# Patient Record
Sex: Female | Born: 1935 | Race: Black or African American | Hispanic: No
Health system: Southern US, Community
[De-identification: ages and names within clinical notes are randomized; demographics above are authoritative.]

---

## 1997-09-12 ENCOUNTER — Ambulatory Visit (HOSPITAL_COMMUNITY): Admission: RE | Admit: 1997-09-12 | Discharge: 1997-09-12 | Payer: Self-pay | Admitting: Nephrology

## 1998-05-07 ENCOUNTER — Encounter: Admission: RE | Admit: 1998-05-07 | Discharge: 1998-06-08 | Payer: Self-pay | Admitting: Unknown Physician Specialty

## 1998-07-26 ENCOUNTER — Encounter: Admission: RE | Admit: 1998-07-26 | Discharge: 1998-09-12 | Payer: Self-pay | Admitting: Orthopaedic Surgery

## 1998-07-28 ENCOUNTER — Emergency Department (HOSPITAL_COMMUNITY): Admission: EM | Admit: 1998-07-28 | Discharge: 1998-07-28 | Payer: Self-pay | Admitting: Emergency Medicine

## 1998-08-06 ENCOUNTER — Encounter: Admission: RE | Admit: 1998-08-06 | Discharge: 1998-11-04 | Payer: Self-pay

## 1999-03-28 ENCOUNTER — Ambulatory Visit (HOSPITAL_BASED_OUTPATIENT_CLINIC_OR_DEPARTMENT_OTHER): Admission: RE | Admit: 1999-03-28 | Discharge: 1999-03-29 | Payer: Self-pay | Admitting: Orthopaedic Surgery

## 1999-04-18 ENCOUNTER — Encounter: Admission: RE | Admit: 1999-04-18 | Discharge: 1999-07-08 | Payer: Self-pay | Admitting: Orthopaedic Surgery

## 1999-04-22 ENCOUNTER — Other Ambulatory Visit: Admission: RE | Admit: 1999-04-22 | Discharge: 1999-04-22 | Payer: Self-pay | Admitting: Obstetrics and Gynecology

## 1999-05-24 ENCOUNTER — Encounter: Admission: RE | Admit: 1999-05-24 | Discharge: 1999-05-24 | Payer: Self-pay | Admitting: Orthopaedic Surgery

## 1999-05-24 ENCOUNTER — Encounter: Payer: Self-pay | Admitting: Orthopaedic Surgery

## 1999-07-02 ENCOUNTER — Encounter: Payer: Self-pay | Admitting: Obstetrics and Gynecology

## 1999-07-02 ENCOUNTER — Encounter: Admission: RE | Admit: 1999-07-02 | Discharge: 1999-07-02 | Payer: Self-pay | Admitting: Obstetrics and Gynecology

## 1999-07-12 ENCOUNTER — Encounter: Payer: Self-pay | Admitting: *Deleted

## 1999-07-12 ENCOUNTER — Ambulatory Visit (HOSPITAL_COMMUNITY): Admission: RE | Admit: 1999-07-12 | Discharge: 1999-07-12 | Payer: Self-pay | Admitting: *Deleted

## 1999-12-16 ENCOUNTER — Encounter: Payer: Self-pay | Admitting: Orthopaedic Surgery

## 1999-12-16 ENCOUNTER — Encounter: Admission: RE | Admit: 1999-12-16 | Discharge: 1999-12-16 | Payer: Self-pay | Admitting: Orthopaedic Surgery

## 2000-04-10 ENCOUNTER — Other Ambulatory Visit: Admission: RE | Admit: 2000-04-10 | Discharge: 2000-04-10 | Payer: Self-pay | Admitting: Obstetrics and Gynecology

## 2000-07-10 ENCOUNTER — Encounter: Admission: RE | Admit: 2000-07-10 | Discharge: 2000-07-10 | Payer: Self-pay | Admitting: Obstetrics and Gynecology

## 2000-07-10 ENCOUNTER — Encounter: Payer: Self-pay | Admitting: Obstetrics and Gynecology

## 2000-08-21 ENCOUNTER — Emergency Department (HOSPITAL_COMMUNITY): Admission: EM | Admit: 2000-08-21 | Discharge: 2000-08-21 | Payer: Self-pay | Admitting: Emergency Medicine

## 2000-08-21 ENCOUNTER — Encounter: Payer: Self-pay | Admitting: Emergency Medicine

## 2000-11-26 ENCOUNTER — Ambulatory Visit (HOSPITAL_BASED_OUTPATIENT_CLINIC_OR_DEPARTMENT_OTHER): Admission: RE | Admit: 2000-11-26 | Discharge: 2000-11-27 | Payer: Self-pay | Admitting: Orthopaedic Surgery

## 2000-12-17 ENCOUNTER — Encounter: Admission: RE | Admit: 2000-12-17 | Discharge: 2001-01-29 | Payer: Self-pay | Admitting: Orthopaedic Surgery

## 2001-04-22 ENCOUNTER — Other Ambulatory Visit: Admission: RE | Admit: 2001-04-22 | Discharge: 2001-04-22 | Payer: Self-pay | Admitting: Obstetrics and Gynecology

## 2001-07-12 ENCOUNTER — Encounter: Payer: Self-pay | Admitting: Family Medicine

## 2001-07-12 ENCOUNTER — Encounter: Admission: RE | Admit: 2001-07-12 | Discharge: 2001-07-12 | Payer: Self-pay | Admitting: Family Medicine

## 2002-04-27 ENCOUNTER — Other Ambulatory Visit: Admission: RE | Admit: 2002-04-27 | Discharge: 2002-04-27 | Payer: Self-pay | Admitting: Gynecology

## 2002-07-09 ENCOUNTER — Emergency Department (HOSPITAL_COMMUNITY): Admission: EM | Admit: 2002-07-09 | Discharge: 2002-07-09 | Payer: Self-pay | Admitting: Emergency Medicine

## 2002-08-03 ENCOUNTER — Ambulatory Visit (HOSPITAL_COMMUNITY): Admission: RE | Admit: 2002-08-03 | Discharge: 2002-08-03 | Payer: Self-pay | Admitting: Family Medicine

## 2002-08-03 ENCOUNTER — Encounter: Payer: Self-pay | Admitting: Family Medicine

## 2002-11-03 ENCOUNTER — Emergency Department (HOSPITAL_COMMUNITY): Admission: EM | Admit: 2002-11-03 | Discharge: 2002-11-03 | Payer: Self-pay | Admitting: Emergency Medicine

## 2003-03-12 ENCOUNTER — Emergency Department (HOSPITAL_COMMUNITY): Admission: AD | Admit: 2003-03-12 | Discharge: 2003-03-12 | Payer: Self-pay | Admitting: Emergency Medicine

## 2003-03-17 ENCOUNTER — Emergency Department (HOSPITAL_COMMUNITY): Admission: EM | Admit: 2003-03-17 | Discharge: 2003-03-17 | Payer: Self-pay | Admitting: Emergency Medicine

## 2003-08-03 ENCOUNTER — Ambulatory Visit (HOSPITAL_COMMUNITY): Admission: RE | Admit: 2003-08-03 | Discharge: 2003-08-03 | Payer: Self-pay | Admitting: Family Medicine

## 2003-08-09 ENCOUNTER — Emergency Department (HOSPITAL_COMMUNITY): Admission: AD | Admit: 2003-08-09 | Discharge: 2003-08-09 | Payer: Self-pay | Admitting: Family Medicine

## 2003-08-10 ENCOUNTER — Emergency Department (HOSPITAL_COMMUNITY): Admission: EM | Admit: 2003-08-10 | Discharge: 2003-08-10 | Payer: Self-pay | Admitting: Family Medicine

## 2003-09-08 ENCOUNTER — Emergency Department (HOSPITAL_COMMUNITY): Admission: EM | Admit: 2003-09-08 | Discharge: 2003-09-08 | Payer: Self-pay | Admitting: Family Medicine

## 2003-09-11 ENCOUNTER — Emergency Department (HOSPITAL_COMMUNITY): Admission: EM | Admit: 2003-09-11 | Discharge: 2003-09-11 | Payer: Self-pay | Admitting: Family Medicine

## 2003-09-12 ENCOUNTER — Emergency Department (HOSPITAL_COMMUNITY): Admission: EM | Admit: 2003-09-12 | Discharge: 2003-09-12 | Payer: Self-pay | Admitting: Emergency Medicine

## 2003-11-19 ENCOUNTER — Emergency Department (HOSPITAL_COMMUNITY): Admission: EM | Admit: 2003-11-19 | Discharge: 2003-11-19 | Payer: Self-pay | Admitting: Family Medicine

## 2004-01-19 ENCOUNTER — Emergency Department (HOSPITAL_COMMUNITY): Admission: EM | Admit: 2004-01-19 | Discharge: 2004-01-19 | Payer: Self-pay | Admitting: Family Medicine

## 2004-02-18 ENCOUNTER — Emergency Department (HOSPITAL_COMMUNITY): Admission: EM | Admit: 2004-02-18 | Discharge: 2004-02-18 | Payer: Self-pay | Admitting: Emergency Medicine

## 2004-05-09 ENCOUNTER — Emergency Department (HOSPITAL_COMMUNITY): Admission: EM | Admit: 2004-05-09 | Discharge: 2004-05-09 | Payer: Self-pay | Admitting: Family Medicine

## 2004-05-21 ENCOUNTER — Other Ambulatory Visit: Admission: RE | Admit: 2004-05-21 | Discharge: 2004-05-21 | Payer: Self-pay | Admitting: Gynecology

## 2004-06-11 ENCOUNTER — Emergency Department (HOSPITAL_COMMUNITY): Admission: EM | Admit: 2004-06-11 | Discharge: 2004-06-11 | Payer: Self-pay | Admitting: Family Medicine

## 2004-06-11 ENCOUNTER — Ambulatory Visit (HOSPITAL_COMMUNITY): Admission: RE | Admit: 2004-06-11 | Discharge: 2004-06-11 | Payer: Self-pay | Admitting: Family Medicine

## 2004-06-15 ENCOUNTER — Emergency Department (HOSPITAL_COMMUNITY): Admission: EM | Admit: 2004-06-15 | Discharge: 2004-06-15 | Payer: Self-pay | Admitting: Emergency Medicine

## 2004-07-11 ENCOUNTER — Emergency Department (HOSPITAL_COMMUNITY): Admission: EM | Admit: 2004-07-11 | Discharge: 2004-07-12 | Payer: Self-pay | Admitting: Emergency Medicine

## 2004-08-06 ENCOUNTER — Ambulatory Visit (HOSPITAL_COMMUNITY): Admission: RE | Admit: 2004-08-06 | Discharge: 2004-08-06 | Payer: Self-pay | Admitting: Family Medicine

## 2004-08-24 ENCOUNTER — Emergency Department (HOSPITAL_COMMUNITY): Admission: EM | Admit: 2004-08-24 | Discharge: 2004-08-24 | Payer: Self-pay | Admitting: Family Medicine

## 2004-09-08 ENCOUNTER — Emergency Department (HOSPITAL_COMMUNITY): Admission: EM | Admit: 2004-09-08 | Discharge: 2004-09-08 | Payer: Self-pay | Admitting: Family Medicine

## 2004-10-25 ENCOUNTER — Emergency Department (HOSPITAL_COMMUNITY): Admission: EM | Admit: 2004-10-25 | Discharge: 2004-10-25 | Payer: Self-pay | Admitting: Family Medicine

## 2004-11-18 ENCOUNTER — Emergency Department (HOSPITAL_COMMUNITY): Admission: EM | Admit: 2004-11-18 | Discharge: 2004-11-18 | Payer: Self-pay | Admitting: Family Medicine

## 2004-12-01 ENCOUNTER — Ambulatory Visit: Payer: Self-pay | Admitting: Physical Medicine & Rehabilitation

## 2004-12-01 ENCOUNTER — Inpatient Hospital Stay (HOSPITAL_COMMUNITY): Admission: EM | Admit: 2004-12-01 | Discharge: 2004-12-11 | Payer: Self-pay | Admitting: Emergency Medicine

## 2004-12-02 ENCOUNTER — Encounter: Payer: Self-pay | Admitting: Cardiology

## 2004-12-02 ENCOUNTER — Ambulatory Visit: Payer: Self-pay | Admitting: Internal Medicine

## 2005-02-10 ENCOUNTER — Encounter: Admission: RE | Admit: 2005-02-10 | Discharge: 2005-03-05 | Payer: Self-pay | Admitting: Family Medicine

## 2005-02-23 ENCOUNTER — Encounter: Admission: RE | Admit: 2005-02-23 | Discharge: 2005-02-23 | Payer: Self-pay | Admitting: Orthopaedic Surgery

## 2005-03-11 ENCOUNTER — Ambulatory Visit: Payer: Self-pay

## 2005-03-12 ENCOUNTER — Ambulatory Visit: Payer: Self-pay | Admitting: *Deleted

## 2005-04-26 ENCOUNTER — Emergency Department (HOSPITAL_COMMUNITY): Admission: EM | Admit: 2005-04-26 | Discharge: 2005-04-26 | Payer: Self-pay | Admitting: Family Medicine

## 2005-05-01 ENCOUNTER — Emergency Department (HOSPITAL_COMMUNITY): Admission: EM | Admit: 2005-05-01 | Discharge: 2005-05-01 | Payer: Self-pay | Admitting: Family Medicine

## 2005-05-23 ENCOUNTER — Other Ambulatory Visit: Admission: RE | Admit: 2005-05-23 | Discharge: 2005-05-23 | Payer: Self-pay | Admitting: Gynecology

## 2005-08-05 ENCOUNTER — Ambulatory Visit (HOSPITAL_COMMUNITY): Admission: RE | Admit: 2005-08-05 | Discharge: 2005-08-05 | Payer: Self-pay | Admitting: Gynecology

## 2005-09-15 ENCOUNTER — Emergency Department (HOSPITAL_COMMUNITY): Admission: EM | Admit: 2005-09-15 | Discharge: 2005-09-15 | Payer: Self-pay | Admitting: Family Medicine

## 2005-10-26 ENCOUNTER — Inpatient Hospital Stay (HOSPITAL_COMMUNITY): Admission: EM | Admit: 2005-10-26 | Discharge: 2005-10-30 | Payer: Self-pay | Admitting: Emergency Medicine

## 2006-02-08 ENCOUNTER — Emergency Department (HOSPITAL_COMMUNITY): Admission: EM | Admit: 2006-02-08 | Discharge: 2006-02-08 | Payer: Self-pay | Admitting: Emergency Medicine

## 2006-10-03 ENCOUNTER — Emergency Department (HOSPITAL_COMMUNITY): Admission: EM | Admit: 2006-10-03 | Discharge: 2006-10-03 | Payer: Self-pay | Admitting: Emergency Medicine

## 2006-12-08 ENCOUNTER — Emergency Department (HOSPITAL_COMMUNITY): Admission: EM | Admit: 2006-12-08 | Discharge: 2006-12-08 | Payer: Self-pay | Admitting: Emergency Medicine

## 2007-02-16 ENCOUNTER — Emergency Department (HOSPITAL_COMMUNITY): Admission: EM | Admit: 2007-02-16 | Discharge: 2007-02-16 | Payer: Self-pay | Admitting: Emergency Medicine

## 2007-05-22 ENCOUNTER — Emergency Department (HOSPITAL_COMMUNITY): Admission: EM | Admit: 2007-05-22 | Discharge: 2007-05-23 | Payer: Self-pay | Admitting: Emergency Medicine

## 2007-10-02 ENCOUNTER — Emergency Department (HOSPITAL_COMMUNITY): Admission: EM | Admit: 2007-10-02 | Discharge: 2007-10-03 | Payer: Self-pay | Admitting: Emergency Medicine

## 2008-12-27 ENCOUNTER — Inpatient Hospital Stay (HOSPITAL_COMMUNITY): Admission: EM | Admit: 2008-12-27 | Discharge: 2009-01-02 | Payer: Self-pay | Admitting: Emergency Medicine

## 2008-12-27 ENCOUNTER — Ambulatory Visit: Payer: Self-pay | Admitting: Cardiology

## 2008-12-28 ENCOUNTER — Encounter (INDEPENDENT_AMBULATORY_CARE_PROVIDER_SITE_OTHER): Payer: Self-pay | Admitting: Internal Medicine

## 2008-12-29 ENCOUNTER — Encounter (INDEPENDENT_AMBULATORY_CARE_PROVIDER_SITE_OTHER): Payer: Self-pay | Admitting: Internal Medicine

## 2009-01-01 ENCOUNTER — Ambulatory Visit: Payer: Self-pay | Admitting: Vascular Surgery

## 2009-04-30 ENCOUNTER — Emergency Department (HOSPITAL_COMMUNITY): Admission: EM | Admit: 2009-04-30 | Discharge: 2009-05-01 | Payer: Self-pay | Admitting: Emergency Medicine

## 2009-10-19 ENCOUNTER — Emergency Department (HOSPITAL_BASED_OUTPATIENT_CLINIC_OR_DEPARTMENT_OTHER): Admission: EM | Admit: 2009-10-19 | Discharge: 2009-10-19 | Payer: Self-pay | Admitting: Emergency Medicine

## 2009-10-19 ENCOUNTER — Ambulatory Visit: Payer: Self-pay | Admitting: Diagnostic Radiology

## 2009-12-09 ENCOUNTER — Emergency Department (HOSPITAL_BASED_OUTPATIENT_CLINIC_OR_DEPARTMENT_OTHER): Admission: EM | Admit: 2009-12-09 | Discharge: 2009-12-09 | Payer: Self-pay | Admitting: Emergency Medicine

## 2009-12-09 ENCOUNTER — Ambulatory Visit: Payer: Self-pay | Admitting: Diagnostic Radiology

## 2010-01-16 ENCOUNTER — Emergency Department (HOSPITAL_COMMUNITY): Admission: EM | Admit: 2010-01-16 | Discharge: 2010-01-16 | Payer: Self-pay | Admitting: Emergency Medicine

## 2010-06-02 ENCOUNTER — Encounter: Payer: Self-pay | Admitting: Gynecology

## 2010-07-25 LAB — URINE CULTURE
Colony Count: NO GROWTH
Culture  Setup Time: 201109080120

## 2010-07-25 LAB — URINE MICROSCOPIC-ADD ON

## 2010-07-25 LAB — DIFFERENTIAL
Basophils Relative: 0 % (ref 0–1)
Eosinophils Absolute: 0.1 10*3/uL (ref 0.0–0.7)
Eosinophils Relative: 2 % (ref 0–5)
Lymphocytes Relative: 30 % (ref 12–46)
Lymphs Abs: 1.7 10*3/uL (ref 0.7–4.0)
Monocytes Absolute: 0.3 10*3/uL (ref 0.1–1.0)
Monocytes Relative: 6 % (ref 3–12)

## 2010-07-25 LAB — URINALYSIS, ROUTINE W REFLEX MICROSCOPIC
Glucose, UA: NEGATIVE mg/dL
Ketones, ur: NEGATIVE mg/dL
Nitrite: NEGATIVE
Protein, ur: NEGATIVE mg/dL
Specific Gravity, Urine: 1.013 (ref 1.005–1.030)
Urobilinogen, UA: 0.2 mg/dL (ref 0.0–1.0)
pH: 5.5 (ref 5.0–8.0)

## 2010-07-25 LAB — CBC
Hemoglobin: 11.7 g/dL — ABNORMAL LOW (ref 12.0–15.0)
MCH: 33.5 pg (ref 26.0–34.0)
MCHC: 34.1 g/dL (ref 30.0–36.0)
RDW: 13 % (ref 11.5–15.5)

## 2010-07-25 LAB — BASIC METABOLIC PANEL
BUN: 31 mg/dL — ABNORMAL HIGH (ref 6–23)
Chloride: 104 mEq/L (ref 96–112)
Creatinine, Ser: 1.02 mg/dL (ref 0.4–1.2)
Sodium: 140 mEq/L (ref 135–145)

## 2010-07-25 LAB — VALPROIC ACID LEVEL: Valproic Acid Lvl: <10 ug/mL — ABNORMAL LOW (ref 50.0–100.0)

## 2010-08-12 LAB — URINALYSIS, ROUTINE W REFLEX MICROSCOPIC
Nitrite: NEGATIVE
pH: 6 (ref 5.0–8.0)

## 2010-08-12 LAB — URINE MICROSCOPIC-ADD ON

## 2010-08-12 LAB — COMPREHENSIVE METABOLIC PANEL
AST: 26 U/L (ref 0–37)
Alkaline Phosphatase: 80 U/L (ref 39–117)
Calcium: 9.6 mg/dL (ref 8.4–10.5)
Creatinine, Ser: 1.03 mg/dL (ref 0.4–1.2)
GFR calc Af Amer: >60 mL/min (ref >60)
GFR calc non Af Amer: 52 mL/min — ABNORMAL LOW (ref >60)
Glucose, Bld: 196 mg/dL — ABNORMAL HIGH (ref 70–99)
Total Bilirubin: 0.6 mg/dL (ref 0.3–1.2)

## 2010-08-12 LAB — CBC
Hemoglobin: 11.8 g/dL — ABNORMAL LOW (ref 12.0–15.0)
Platelets: 213 10*3/uL (ref 150–400)
RBC: 3.41 MIL/uL — ABNORMAL LOW (ref 3.87–5.11)

## 2010-08-12 LAB — DIFFERENTIAL
Basophils Relative: 1 % (ref 0–1)
Eosinophils Absolute: 0 10*3/uL (ref 0.0–0.7)
Eosinophils Relative: 0 % (ref 0–5)
Lymphocytes Relative: 15 % (ref 12–46)
Lymphs Abs: 1 10*3/uL (ref 0.7–4.0)
Monocytes Relative: 3 % (ref 3–12)
Neutro Abs: 5.2 10*3/uL (ref 1.7–7.7)

## 2010-08-12 LAB — URINE CULTURE

## 2010-08-17 LAB — GLUCOSE, CAPILLARY
Glucose-Capillary: 101 mg/dL — ABNORMAL HIGH (ref 70–99)
Glucose-Capillary: 107 mg/dL — ABNORMAL HIGH (ref 70–99)
Glucose-Capillary: 111 mg/dL — ABNORMAL HIGH (ref 70–99)
Glucose-Capillary: 114 mg/dL — ABNORMAL HIGH (ref 70–99)
Glucose-Capillary: 128 mg/dL — ABNORMAL HIGH (ref 70–99)
Glucose-Capillary: 145 mg/dL — ABNORMAL HIGH (ref 70–99)
Glucose-Capillary: 50 mg/dL — ABNORMAL LOW (ref 70–99)
Glucose-Capillary: 75 mg/dL (ref 70–99)
Glucose-Capillary: 81 mg/dL (ref 70–99)
Glucose-Capillary: 87 mg/dL (ref 70–99)
Glucose-Capillary: 88 mg/dL (ref 70–99)
Glucose-Capillary: 93 mg/dL (ref 70–99)
Glucose-Capillary: 95 mg/dL (ref 70–99)
Glucose-Capillary: 98 mg/dL (ref 70–99)

## 2010-08-17 LAB — POCT I-STAT, CHEM 8
Calcium, Ion: 1.14 mmol/L (ref 1.12–1.32)
Glucose, Bld: 140 mg/dL — ABNORMAL HIGH (ref 70–99)
HCT: 30 % — ABNORMAL LOW (ref 36.0–46.0)
Hemoglobin: 10.2 g/dL — ABNORMAL LOW (ref 12.0–15.0)
Potassium: 3.3 mEq/L — ABNORMAL LOW (ref 3.5–5.1)

## 2010-08-17 LAB — IRON AND TIBC: Iron: 46 ug/dL (ref 42–135)

## 2010-08-17 LAB — CBC
HCT: 29.7 % — ABNORMAL LOW (ref 36.0–46.0)
MCV: 97.3 fL (ref 78.0–100.0)
RBC: 3.06 MIL/uL — ABNORMAL LOW (ref 3.87–5.11)
WBC: 6.3 10*3/uL (ref 4.0–10.5)

## 2010-08-17 LAB — CARDIAC PANEL(CRET KIN+CKTOT+MB+TROPI)
CK, MB: 1.8 ng/mL (ref 0.3–4.0)
Relative Index: 1.2 (ref 0.0–2.5)
Relative Index: 1.5 (ref 0.0–2.5)
Total CK: 151 U/L (ref 7–177)
Troponin I: 0.12 ng/mL — ABNORMAL HIGH (ref 0.00–0.06)

## 2010-08-17 LAB — LIPID PANEL
Cholesterol: 220 mg/dL — ABNORMAL HIGH (ref 0–200)
HDL: 60 mg/dL (ref >39)
LDL Cholesterol: 141 mg/dL — ABNORMAL HIGH (ref 0–99)
Total CHOL/HDL Ratio: 3.7 RATIO

## 2010-08-17 LAB — HEMOGLOBIN A1C: Hgb A1c MFr Bld: 6.6 % — ABNORMAL HIGH (ref 4.6–6.1)

## 2010-08-17 LAB — POTASSIUM: Potassium: 3.6 mEq/L (ref 3.5–5.1)

## 2010-08-18 ENCOUNTER — Inpatient Hospital Stay (HOSPITAL_COMMUNITY)
Admission: EM | Admit: 2010-08-18 | Discharge: 2010-08-20 | DRG: 689 | Disposition: A | Discharge status: Skilled Nursing Facility | Payer: Medicare Other | Attending: Internal Medicine | Admitting: Internal Medicine

## 2010-08-18 ENCOUNTER — Emergency Department (HOSPITAL_COMMUNITY): Payer: Medicare Other

## 2010-08-18 DIAGNOSIS — R4789 Other speech disturbances: Secondary | ICD-10-CM | POA: Diagnosis present

## 2010-08-18 DIAGNOSIS — E1169 Type 2 diabetes mellitus with other specified complication: Secondary | ICD-10-CM | POA: Diagnosis present

## 2010-08-18 DIAGNOSIS — L89609 Pressure ulcer of unspecified heel, unspecified stage: Secondary | ICD-10-CM | POA: Diagnosis present

## 2010-08-18 DIAGNOSIS — S0003XA Contusion of scalp, initial encounter: Secondary | ICD-10-CM | POA: Diagnosis present

## 2010-08-18 DIAGNOSIS — Y998 Other external cause status: Secondary | ICD-10-CM

## 2010-08-18 DIAGNOSIS — G309 Alzheimer's disease, unspecified: Secondary | ICD-10-CM | POA: Diagnosis present

## 2010-08-18 DIAGNOSIS — L8994 Pressure ulcer of unspecified site, stage 4: Secondary | ICD-10-CM | POA: Diagnosis present

## 2010-08-18 DIAGNOSIS — W19XXXA Unspecified fall, initial encounter: Secondary | ICD-10-CM | POA: Diagnosis present

## 2010-08-18 DIAGNOSIS — I10 Essential (primary) hypertension: Secondary | ICD-10-CM | POA: Diagnosis present

## 2010-08-18 DIAGNOSIS — R4182 Altered mental status, unspecified: Secondary | ICD-10-CM | POA: Diagnosis present

## 2010-08-18 DIAGNOSIS — N390 Urinary tract infection, site not specified: Principal | ICD-10-CM | POA: Diagnosis present

## 2010-08-18 DIAGNOSIS — F0280 Dementia in other diseases classified elsewhere without behavioral disturbance: Secondary | ICD-10-CM | POA: Diagnosis present

## 2010-08-18 DIAGNOSIS — D638 Anemia in other chronic diseases classified elsewhere: Secondary | ICD-10-CM | POA: Diagnosis present

## 2010-08-18 DIAGNOSIS — Z8673 Personal history of transient ischemic attack (TIA), and cerebral infarction without residual deficits: Secondary | ICD-10-CM

## 2010-08-18 DIAGNOSIS — I2510 Atherosclerotic heart disease of native coronary artery without angina pectoris: Secondary | ICD-10-CM | POA: Diagnosis present

## 2010-08-18 DIAGNOSIS — S0083XA Contusion of other part of head, initial encounter: Secondary | ICD-10-CM | POA: Diagnosis present

## 2010-08-18 DIAGNOSIS — E785 Hyperlipidemia, unspecified: Secondary | ICD-10-CM | POA: Diagnosis present

## 2010-08-18 LAB — GLUCOSE, CAPILLARY
Glucose-Capillary: 135 mg/dL — ABNORMAL HIGH (ref 70–99)
Glucose-Capillary: 144 mg/dL — ABNORMAL HIGH (ref 70–99)
Glucose-Capillary: 42 mg/dL — CL (ref 70–99)

## 2010-08-18 LAB — CBC
Platelets: 269 10*3/uL (ref 150–400)
RBC: 2.64 MIL/uL — ABNORMAL LOW (ref 3.87–5.11)
RDW: 13.1 % (ref 11.5–15.5)
WBC: 8.5 10*3/uL (ref 4.0–10.5)

## 2010-08-18 LAB — URINALYSIS, ROUTINE W REFLEX MICROSCOPIC
Bilirubin Urine: NEGATIVE
Nitrite: NEGATIVE
Protein, ur: 30 mg/dL — AB
Specific Gravity, Urine: 1.019 (ref 1.005–1.030)
Urobilinogen, UA: 0.2 mg/dL (ref 0.0–1.0)

## 2010-08-18 LAB — POCT I-STAT, CHEM 8
BUN: 92 mg/dL — ABNORMAL HIGH (ref 6–23)
Chloride: 104 mEq/L (ref 96–112)
HCT: 26 % — ABNORMAL LOW (ref 36.0–46.0)
Sodium: 135 mEq/L (ref 135–145)

## 2010-08-18 LAB — URINE MICROSCOPIC-ADD ON

## 2010-08-19 LAB — ABO/RH: ABO/RH(D): A POS

## 2010-08-19 LAB — CBC
HCT: 23.9 % — ABNORMAL LOW (ref 36.0–46.0)
Hemoglobin: 8.7 g/dL — ABNORMAL LOW (ref 12.0–15.0)
MCHC: 32.8 g/dL (ref 30.0–36.0)
MCV: 96.8 fL (ref 78.0–100.0)
RDW: 13.2 % (ref 11.5–15.5)
RDW: 13 % (ref 11.5–15.5)
WBC: 6.3 10*3/uL (ref 4.0–10.5)

## 2010-08-19 LAB — CARDIAC PANEL(CRET KIN+CKTOT+MB+TROPI)
CK, MB: 3.3 ng/mL (ref 0.3–4.0)
CK, MB: 3.9 ng/mL (ref 0.3–4.0)
Relative Index: 2.8 — ABNORMAL HIGH (ref 0.0–2.5)
Relative Index: 2.8 — ABNORMAL HIGH (ref 0.0–2.5)
Total CK: 108 U/L (ref 7–177)
Troponin I: 0.04 ng/mL (ref 0.00–0.06)
Troponin I: 0.08 ng/mL — ABNORMAL HIGH (ref 0.00–0.06)

## 2010-08-19 LAB — DIFFERENTIAL
Basophils Absolute: 0 10*3/uL (ref 0.0–0.1)
Basophils Relative: 0 % (ref 0–1)
Eosinophils Relative: 1 % (ref 0–5)
Monocytes Absolute: 0.6 10*3/uL (ref 0.1–1.0)
Neutro Abs: 6.2 10*3/uL (ref 1.7–7.7)

## 2010-08-19 LAB — GLUCOSE, CAPILLARY
Glucose-Capillary: 102 mg/dL — ABNORMAL HIGH (ref 70–99)
Glucose-Capillary: 75 mg/dL (ref 70–99)
Glucose-Capillary: 96 mg/dL (ref 70–99)

## 2010-08-19 LAB — IRON AND TIBC: UIBC: 185 ug/dL

## 2010-08-19 LAB — LIPID PANEL
LDL Cholesterol: 92 mg/dL (ref 0–99)
Triglycerides: 105 mg/dL (ref <150)

## 2010-08-19 LAB — HEMOGLOBIN A1C: Hgb A1c MFr Bld: 5 % (ref <5.7)

## 2010-08-19 LAB — MRSA PCR SCREENING: MRSA by PCR: NEGATIVE

## 2010-08-20 LAB — VITAMIN B12: Vitamin B-12: 495 pg/mL (ref 211–911)

## 2010-08-20 LAB — COMPREHENSIVE METABOLIC PANEL
Alkaline Phosphatase: 65 U/L (ref 39–117)
BUN: 33 mg/dL — ABNORMAL HIGH (ref 6–23)
CO2: 24 mEq/L (ref 19–32)
Chloride: 108 mEq/L (ref 96–112)
Creatinine, Ser: 0.92 mg/dL (ref 0.4–1.2)
GFR calc non Af Amer: 59 mL/min — ABNORMAL LOW (ref >60)
Glucose, Bld: 109 mg/dL — ABNORMAL HIGH (ref 70–99)
Potassium: 4.2 mEq/L (ref 3.5–5.1)
Total Bilirubin: 0.4 mg/dL (ref 0.3–1.2)

## 2010-08-20 LAB — CBC
Hemoglobin: 8.9 g/dL — ABNORMAL LOW (ref 12.0–15.0)
MCHC: 33.5 g/dL (ref 30.0–36.0)
Platelets: 281 10*3/uL (ref 150–400)

## 2010-08-20 LAB — FOLATE: Folate: 18.2 ng/mL

## 2010-08-20 LAB — FERRITIN: Ferritin: 320 ng/mL — ABNORMAL HIGH (ref 10–291)

## 2010-08-20 LAB — GLUCOSE, CAPILLARY
Glucose-Capillary: 86 mg/dL (ref 70–99)
Glucose-Capillary: 95 mg/dL (ref 70–99)

## 2010-08-21 LAB — CROSSMATCH: Unit division: 0

## 2010-08-25 LAB — CULTURE, BLOOD (ROUTINE X 2)

## 2010-08-27 NOTE — H&P (Signed)
Sara [name], Sara [name]:  [id number]  MEDICAL RECORD NO.:  [id number]           PATIENT TYPE:  E  LOCATION:  [facility]                         FACILITY:  [facility]  PHYSICIAN:  Lonia Blood, M.D.      DATE OF BIRTH:  1930-03-28  DATE OF ADMISSION:  08/18/2010 DATE OF DISCHARGE:                             HISTORY & PHYSICAL   PRIMARY CARE PHYSICIAN:  Medical laboratory scientific officer.  PRESENTING COMPLAINT:  Fall and hematoma to the head.  HISTORY OF PRESENT ILLNESS:  The patient is a 77 year old female brought in from [facility] where she states. She has baseline dementia.  She apparently had a fall today and sustained some right frontal hematoma.  After that they noted that she was more confused than usual.  She also had some slurred speech.  Hence she was brought into the emergency room for further management.  The patient is confused, but pleasantly confused.  She seems to be slightly demented, combative sometimes, not allowing her to be examined appropriately.  So history is really obtained from nursing home records from previous hospitalizations.  The patient denied any specific complaint at this point.  PAST MEDICAL HISTORY:  Significant for coronary artery disease, history of CVA,  Alzheimer's dementia, diabetes, hyperlipidemia, hypertension, previous fall with right hip fracture, history of blood loss anemia after hip fracture, and surgery status post left femoral neck fracture repair.  ALLERGIES:  No known drug allergies.  CURRENT MEDICATIONS:  Aricept, Atarax, clopidogrel, Depakote, Glucotrol, and Zestril.  SOCIAL HISTORY:  The patient currently lives in the nursing home at [facility].  No tobacco, alcohol, or IV drug use.  She is demented and relies heavily on assistance.  FAMILY HISTORY:  Noncontributory.  REVIEW OF SYSTEMS:  All systems reviewed so far are negative except per HPI.  PHYSICAL EXAMINATION:  VITAL SIGNS:   Temperature is 97.4, blood pressure 144/54, her pulse is 74, respiratory rate of 15, sats 100% room air. GENERAL:  She is awake, alert, and she is awake but confused.  She is not oriented in person, place. HEENT:  She has a huge hematoma on the right part of her forehead that is slightly above her socket.  PERRL.  EOMI.  No pallor, no jaundice. No rhinorrhea. NECK:  Supple.  No JVD.  No lymphadenopathy. RESPIRATORY:  She has good air entry bilaterally.  No wheezes, no rales. No crackles. CARDIOVASCULAR:  She has S1 and S2.  No murmur. ABDOMEN:  Soft and nontender with positive bowel sounds. EXTREMITIES:  No edema.  Cyanosis or clubbing.  Her left lower extremity has an ulcer which is currently dressed.  Right lower extremity.  No edema. SKIN:  Again she has left lower extremity foot ulcer.  Otherwise she has a hematoma and mild bruising in her forehead but no other bruises observed.  LABORATORY DATA:  Her labs white count is 8.5, hemoglobin 8.5 with platelet of 269.  Sodium 135, potassium 4.4, chloride 104, CO2 25, her BUN is 92 with creatinine of 1.1.  Her urinalysis showed turbid urine with moderate hemoglobin, proteinuria, large leukocyte esterase, wbc's are  too numerous to count, rbc 3-6, and many bacteria.  Her glucose is 144 now.  Her CT head without contrast showed no acute intracranial findings.  There is evidence of previous CVA involving the parietal lobe and the right parietal lobe.  There is associated encephalomalacia and minimal calcification.  There was small scalp hematoma overlying the lateral frontal calvarium.  Chronic lacuna infarcts within the basal ganglia, significant high-density Serevent within the right external auditory canal.  ASSESSMENT:  This is a 77 year old female presenting from nursing facility after a fall.  It is not clear if the fall was mechanical or if she passed.  She was however found to have a glucose of 44 which may explain the reason for  the fall.  She is a diabetic with significant anemia which is normocytic.  Also she urinary tract infection, coronary artery disease, and history of cerebrovascular accident.  PLAN: 1. Altered mental status more than likely from a combination of UTI     and had hypoglycemia.  She is also demented and currently seems     only confused.  She is probably close to her baseline as we speak. 2. Hypoglycemia, more than likely is related to her medications.  She     is on Glucotrol.  For her age these drugs has propensity for     hypoglycemic episodes.  She will need to probably be switched to     another oral hypoglycemics and avoid the Glucotrol and related     drugs.  In the meantime, I will put her on sliding scale insulin.     Continue with D5 as needed.  Continue to monitor her glucose in the     hospital.  Status post fall.  The patient is already in a nursing     facility.  We will get PT/OT in the hospital and hopefully she will     continue with PT/OT in the nursing facility. 3. Diabetes.  Again, we will continue sliding scale insulin and     probably take her Glucotrol at the time of discharge. 4. Hypertension, blood pressure seems reasonable.  We will continue     with home medications. 5. Hyperlipidemia.  We will continue again with home medicines     throughout this hospitalization. 6. Left lower extremity ulcer.  We will get wound care consult. 7. UTI.  I will put her on some IV Rocephin. 8. History of coronary artery disease, I will cycle her enzymes and     follow closely. 9. Anemia.  It is not clear whether this is anemia of chronic disease     or acute blood loss.  I will check stool guaiacs after finishing.     Follow serial H and H.  Type and crossmatch for packed red blood     cells.  If her hemoglobin drops below 8, I will transfuse her. 10.Also history of coronary artery disease, I hope that we will be     able to get her hemoglobin closely to 10. 11.Alzheimer  dementia.  Continue with Aricept.     Lonia Blood, M.D.     Verlin Grills  D:  08/19/2010  T:  08/19/2010  Job:  [id number]  Electronically Signed by Lonia Blood M.D. on 08/27/2010 04:10:35 PM

## 2010-09-09 NOTE — Discharge Summary (Signed)
Sara [name], LABARRE NO.:  [id number]  MEDICAL RECORD NO.:  [id number]           PATIENT TYPE:  O  LOCATION:  3735                         FACILITY:  [facility]  PHYSICIAN:  Marcellus Scott, MD     DATE OF BIRTH:  [birth year]  DATE OF ADMISSION:  08/18/2010 DATE OF DISCHARGE:  08/20/2010                        DISCHARGE SUMMARY - REFERRING   PRIMARY CARE PHYSICIAN:  Dr. Baltazar Najjar at [facility].  Nursing facility is [facility].  DISCHARGE DIAGNOSES: 1. Urinary tract infection. 2. Hypoglycemia in type 2 diabetes mellitus. 3. Fall. 4. Anemia. 5. Dementia with altered mental status. 6. Left lower extremity ulcer. 7. No code blue. 8. History of coronary artery disease, cerebrovascular accident,     hyperlipidemia. 9. Hypertension.  DISCHARGE MEDICATIONS: 1. Ceftin 500 mg p.o. b.i.d. for 5 days. 2. Aricept 10 mg p.o. at bedtime. 3. Atarax 25 mg p.o. q.6 hourly p.r.n. for itching. 4. Beneprotein powder 6 grams per dose, 6 grams mixed with liquid or     soft food p.o. b.i.d. 5. Clopidogrel 75 mg p.o. daily. 6. Certa-Vite over-the-counter 1 tablet p.o. daily. 7. Depakote 125 mg p.o. at bedtime. 8. Lisinopril 20 mg p.o. daily. 9. Vicodin 5/500, 1 tablet p.o. q.4 hourly p.r.n. for pain to be given     30 minutes before all dressing changes.  Total dose of Tylenol to     not exceed 4 grams in 24 hours.  DISCONTINUED MEDICATIONS:  Glipizide XL  PROCEDURES/IMAGING: 1. CT of the head without contrast. Impression: A.  No evidence of traumatic intracranial injury or fracture. B.  Small scalp hematoma overlying the lateral frontal calvarium. C.  Moderately severe cortical volume loss and diffuse small vessel ischemic microangiopathy. D.  Chronic infarct in the right parietal lobe, with associated encephalomalacia and minimal calcification. E.  Chronic lacunar infarcts within the basal ganglion. F.  Significant high-density  cerumen noted within the right external auditory canal.  LABORATORY DATA:  Comprehensive metabolic panel significant for glucose 109, BUN 33, creatinine 0.92, albumin 2.6.  Blood culture x1 is no growth to date.  CBC:  Hemoglobin 8.9, hematocrit 27, white blood cell 5.7, platelets 281,000.  Anemia panel:  Iron 58, total iron binding capacity 243%, desaturation 24, serum folate 18.2, ferritin 320, hemoglobin A1c 5. Cardiac enzymes cycled x3 showed one troponin of 0.08, otherwise normal.  TSH 4.333.  Lipid panel within normal limits.  BNP 167.  MRSA PCR screening was negative.  Urinalysis with too numerous to count white blood cells and many bacteria.  CONSULTATIONS:  Wound care nursing.  DIET:  Diabetic diet.  ACTIVITY:  Out of bed with assist and per skilled nursing facility, physical therapy, and occupational therapy evaluation.  WOUND CARE INSTRUCTIONS:  Left heel boots to reduce pressure to left lower extremity.  Moist gauze dressing to left calf, left toe, left foot, left heel to assist with removal of nonviable tissue twice a day.  COMPLAINTS TODAY:  None.  The patient is pleasantly confused.  She refused in and out cath for urine cultures.  Yesterday she was refusing vital signs and  lab draws.  PHYSICAL EXAMINATION:  GENERAL:  Sara [name] is in no obvious distress. VITAL SIGNS:  Temperature 97.8 degrees Fahrenheit, pulse 68 per minute, respiration 18 per minute, blood pressure 148/74 mmHg, and saturating at 97% on room air.  CBGs range in the 75-96 mg/dL.  She has not required any hypoglycemic agents. RESPIRATORY SYSTEM:  Clear. CARDIOVASCULAR SYSTEM:  First and second heart sounds heard regular. ABDOMEN: Soft, nondistended.  Bowel sounds present. CENTRAL NERVOUS SYSTEM:  The patient is awake, alert, oriented only to self.  No focal neurological deficits. EXTREMITIES:  With chronic full-thickness wound to left lower extremity and stage IV wound to left heel which has been  present on admission. Left heel has a 2 x 5 x 0.2 cm 80% red, 20% slough small pink drainage, no odor, bone palpable.  Left great toe 2 x 1 cm, 100% dry eschar.  Left inner foot 3 x 2 x 0.2 cm 80% slough, 20% red, bone palpable, small pink drainage, no odor.  Posterior left calf with 2 areas of healing full- thickness wounds, 2 x 1 x 0.1 cm and 2 x 0.5 x 0.1 cm both 100% pink with small tan drainage.  No odor.  HOSPITAL COURSE:  Sara [name] is a 77 year old female patient with history of advanced Alzheimer's dementia, multiple previous strokes, hypertension, type 2 diabetes mellitus on oral hypoglycemics who sustained a fall.  She was not able to provide any history.  There is no details regarding syncope.  She was sent because of slurred speech and altered mental status. 1. Urinary tract infection.  The patient was empirically started on IV     Rocephin.  She declined in and out cath for urine culture sample.     Recommend completing 1 week of total antibiotics and repeating     urinalysis and culture after the antibiotic completion. 2. Hypoglycemia.  Possibly secondary to oral hypoglycemic in the     context of variable oral intake.  Her oral hypoglycemics were     discontinued.  Her blood sugars are well-controlled currently.     Recommend continue to check her CBG t.i.d. before meals and at     bedtime.  Her hypoglycemic medications have been discontinued.  If     her sugars start to increase again, then consider half the dose of     the Glucotrol that she was on prior to admission. 3. Fall possibly secondary to multiple factors such as advanced age,     gait instability, dementia, and altered mental status, hypoglycemia     and urinary tract infection.  For skilled nursing facility, PT and     OT evaluation. 4. Anemia which is possibly from chronic disease.  This has remained     stable. 5. Left lower extremity ulcers.  Management per wound care. 6. No code blue.  This was  confirmed through the skilled nursing     facility records as well as talking to her healthcare power of     attorney, Mr. Jeri Modena. 7. Hypertension, controlled.  DISPOSITION:  The patient will be discharged home in stable condition.  This dictator called Ms. Jeri Modena on the phone and Ms. Clark indicated that the patient had severe dementia and confusion at baseline.  She confirmed the no code blue status.  Also her care was updated to Ms. Clark and her questions were answered.  Time taken in coordinating this discharge is 45 minutes.     Marcellus Scott, MD  AH/MEDQ  D:  08/20/2010  T:  08/20/2010  Job:  [id number]  cc:   Maxwell Caul, M.D. [facility]  Electronically Signed by Marcellus Scott MD on 09/09/2010 09:32:02 PM

## 2010-09-24 NOTE — H&P (Signed)
NAMETERRIANA, [name]                ACCOUNT NO.:  [id number]   MEDICAL RECORD NO.:  [id number]          PATIENT TYPE:  INP   LOCATION:  3035                         FACILITY:  [facility]   PHYSICIAN:  Virgie Dad, MD     DATE OF BIRTH:  1929-10-01   DATE OF ADMISSION:  12/27/2008  DATE OF DISCHARGE:                              HISTORY & PHYSICAL   CHIEF COMPLAINT:  Double vision, right eye and acting funny.   HISTORY OF PRESENT ILLNESS:  [name]  with known Alzheimer, diabetes mellitus, murmur of aortic stenosis,  hypertension with a past history of stroke, and history of myocardial  infarction.   The daughter noticed that her mother keeps on falling down, and that the  right eye seemed not to move at all.  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  No headache.  No vomiting.  No problem with hearing.  No  problem with swallowing.  No problem with weakness of the right side of  the body.  The patient is not aphasic.  Workup in the emergency room  showed that the MRI suspect ischemia of the left dorsal medulla at the  level of the medial longitudinal fasciculus.  No mass effect or  hemorrhage.  There is also a progression of the brainstem small vessel  ischemia since 2008, and there is chronic poor flow of the right  internal carotid artery, and also the MRI showed old remote right middle  carotid artery infarct and infarcts in the corona radiata.  The  electrocardiogram showed sinus rhythm.  No acute ST-T changes.   LABORATORY DATA:  Sodium 142, potassium 3.3, BUN 8, creatinine 1,  glucose 140, and calcium 1.14.  Hemoglobin 10.2 and hematocrit 10.   PAST MEDICAL HISTORY:  She has had several infarcts and had left her  with Alzheimer.  She has diabetes and should be on glipizide, but the  patient states she does not take  any medication.  She has a known murmur  of aortic stenosis with no angina and congestive heart failure.  She has  had myocardial infarction in the past, and as mentioned several strokes.  She does not take any aspirin.   SOCIAL HISTORY:  Lives with her son.  Does not smoke.  Does not drink.   ALLERGIES:  No known drug allergy.   REVIEW OF SYSTEMS:  Inconsistent.  The patient does not seem to  remember.  She feels  like crying, and wants to go home.  The daughter  had mentioned that for 2 days, she had noticed her mother to keep on  falling down, and seemed to be adjusting her head when trying to move  around and that at one time, the mother said that her eyes are acting  funny, and that she is seeing double of the son that she lives with.  The patient was seen by Dr. Vickey Huger, the neurologist in the emergency  room  and had agreed with findings of diplopia with extraocular muscles  of the right eye being affected by the new CVA with resultant right  lateral gaze of the right eye.   PHYSICAL EXAMINATION:  VITAL SIGNS:  Blood pressure is 140/80, but when  I examined her, blood pressure was 170/80; temperature 98.2; pulse 62;  respiration 18; O2 sat on room air is 97%.  GENERAL:  The patient is very talkative, but is in denial, says nothing  wrong with her eye as long as she could hear where she is suppose to  look.  There is no expressive aphasia.  No facial asymmetry.  SKIN:  Warm and dry.  HEENT:  The right eye is fixed on the lateral gaze.  Could not do a  funduscopy.  The patient will not sit still.  There is no drooping of  the nasolabial fold.  The tongue is midline and screening for swallowing  is negative, and the patient was able to eat without any problem.  CHEST:  Clear.  No wheezing.  HEART:  A grade 4 systolic murmur over the aortic area, radiating to the  neck.  The carotid upstroke could not be determined because it is masked  with a very loud murmur of aortic stenosis.   There is no gallop and no  pericardial rub.  ABDOMEN:  Flat.  Liver, spleen, and kidney normal.  EXTREMITIES:  Fairly moving.  No sign of DVT.  No pedal edema.  NEUROLOGIC:  Mental Exam:  The patient has Alzheimer, getting confused  and disoriented.  She thought that one of the other ladies in the room  is her niece, but turned out to be her daughter.  The orientation comes  and goes, but the patient is cooperative.  The motor strength is normal  in both upper and lower extremities.  Deep tendon reflexes are normal.  There is no paresis.  Good hand grip.  The patient is right handed and  gait is normal.  Although, the patient tries to orient her head towards  the voice, trying to hone in the location because of her right lateral  gaze position of the right eye.   ASSESSMENT AND PLAN:  1. 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  This is the only      discernible neurologic deficit with positive MRI confirming the new      ischemia.  We will give her Aggrenox, which is a combination of      aspirin and dipyridamole.  We will order an MRA and carotid      ultrasound.  We will also order cardiac echo in relation to her      aortic stenosis.  2. Aortic stenosis, no angina, no congestive heart failure.  Cardiac      echo to assess degree of stenosis and ejection fraction.  3. We will put the patient on a.c and nightly blood sugar, and sliding      scale regular insulin.  4. Noted is anemia of hemoglobin of 10.2, hematocrit of 30.  We will      check stools for hemoccult blood and do iron study.  5. Noted is the hypertension.  The patient's family stated that the      mother had hypertension, but they do not know whether she is taking      medicine or not.  The patient herself denies any  medicine or      hypertension.  Lasix 40 mg IV was given and subsequent blood      pressure was 140/60, lisinopril 10 mg p.o. daily  was also started,      and Apresoline 20 mg IV q.6 h. for persistent systolic blood      pressure greater than 180.   PROGNOSIS:  Guarded.  Considering she has had previous infarcts and  Alzheimer affects, the family does not know what the brother thinks  about resuscitation status of the patient.  We will verify with the son  who is the primary care giver whether she is full code or not.  Dr. Vickey Huger, Neuro, will see her tomorrow morning.      Virgie Dad, MD  Electronically Signed     EA/MEDQ  D:  12/27/2008  T:  12/28/2008  Job:  [id number]

## 2010-09-24 NOTE — Discharge Summary (Signed)
Sara [name], [name]                ACCOUNT NO.:  [id number]   MEDICAL RECORD NO.:  [id number]          PATIENT TYPE:  INP   LOCATION:  3035                         FACILITY:  [facility]   PHYSICIAN:  Michelene Gardener, MD    DATE OF BIRTH:  07-Oct-1929   DATE OF ADMISSION:  12/27/2008  DATE OF DISCHARGE:  01/02/2009                               DISCHARGE SUMMARY   DISCHARGE DIAGNOSES:  1. Acute left dorsal middle cerebrovascular accident.  2. Bilateral significant carotid artery disease.  3. History of multiple infarcts.  4. Dementia.  5. Diabetes mellitus.  6. Questionable history of coronary artery disease.  7. Hypertension.  8. Hyperlipidemia.   DISCHARGE MEDICATIONS:  1. Norvasc 5 mg once a day.  2. Aggrenox 1 tablet twice daily.  3. Aricept 10 mg at bedtime.  4. Glipizide 5 mg once a day.  5. Lisinopril 20 mg once a day.  6. Zocor 20 mg at bedtime.   CONSULTATIONS:  1. Neuro consult by Dr. Pearlean Brownie.  2. Vascular surgery consult by Dr. Miguel Rota.   PROCEDURES:  None.   DIAGNOSTIC STUDIES.:  1. MRI of the brain without contrast in August 18 showed acute      ischemia in the left dorsal middle at the level of medial      longitudinal fasciculus.  There is no associated mass effect or      hemorrhage and there is a progression of brain stem vessel ischemia      since 2008.  2. CT angiography of the head August 20 showed stable intracranial MRI      with poor flow in the right ICA indicated of significant upstream      stenosis and severe left ICA atherosclerosis.  3. CT angio angiography of the neck on August 21 showed occlusion of      the right common carotid artery which is most likely chronic and      there is reconstitution of the right internal and external carotid      arteries via collaterals and there is moderately severe stenosis      and the proximal right internal carotid artery and heavily      calcified plaque at the left carotid bifurcation with at least 70%     diameter stenosis.  4. Echocardiogram August 19 showed ejection fraction of 60%-65% with      normal wall motion and there is no source of stroke.  5. Carotid Doppler August 20 showed occlusion of the right distal      common carotid artery with internal carotid artery appeared to be      fed by external carotid.  Findings consistent with greater than 80%      stenosis involving the left internal carotid artery.   FOLLOW UP:  1. Primary doctor within a week.  2. Dr. Fabienne Bruns within 2 to 3 weeks for evaluation of possible      carotid endarterectomy.   COURSE OF HOSPITALIZATION:  1. This is a 77 year old female who was brought to the hospital for      evaluation of  double vision and right eye acting funny.  MRI of her      head was done and she was found to have acute stroke in the left      dorsal medulla.  The patient was admitted to neuro/tele.      Echocardiogram, carotid Doppler and CT angio of the head CT angio      of the neck were done and detailed results were mentioned above.      Neurology consultation was done where the patient was started on      Aggrenox and statins.  Physical therapy, occupational therapy and      swallow and Speech evaluated the patient.  The patient was      recommended for skilled nursing facility.  2. Carotid artery disease.  CT angiography of the head and neck were      done in addition to carotid artery were done and detailed results      were as mentioned above.  Vascular surgery consultation was done.      His plan is possible carotid endarterectomy and the patient will      follow with Dr. Darrick Penna as an outpatient.  3. Hypertension.  Blood pressure medications were adjusted.  The      patient currently having good blood pressure control on Norvasc and      lisinopril.  4. Hyperlipidemia, Zocor continued.  5. Diabetes mellitus.  The patient was continued on glipizide.      Hemoglobin A1c was 6.6.  6. Otherwise other medical conditions  remained stable.  The patient      will be discharged today in stable condition.   TOTAL ASSESSMENT TIME:  One hour.      Michelene Gardener, MD  Electronically Signed     NAE/MEDQ  D:  01/02/2009  T:  01/02/2009  Job:  [id number]

## 2010-09-24 NOTE — Consult Note (Signed)
NAMEMANDEEP, FERCH NO.:  [id number]   MEDICAL RECORD NO.:  [id number]          PATIENT TYPE:  INP   LOCATION:  3035                         FACILITY:  [facility]   PHYSICIAN:  Melvyn Novas, M.D.  DATE OF BIRTH:  [birth year]   DATE OF CONSULTATION:  DATE OF DISCHARGE:                                 CONSULTATION   She is a 77 year old African American right-handed female who presented  today with an approximately 24-hour history of diplopia.  No associated  findings, at least not by the patient's chief complaint.  Her daughter  and a grandson are at the bedside today.  The daughter states that the  weakness probably occurred with left-sided weakness, it was probably  seen yesterday, but today when she visited her mother she noticed that  her right eye turned outwards and she also believes to have seen left-  sided facial droop.  She insisted on her mother coming to the hospital  apparently against the mother's resistance.  Ms. Zinger is not willing  to stay even in the ER for another hour or two when I explained that she  will have an MRI study today to look for possible stroke.  She was very  difficult to convince to stay.  A CT scan of the brain had already been  obtained without any results for an acute or active lesion.   PAST MEDICAL HISTORY:  1. Progressive dementia with psychosis.  2. Mild ataxia with stereotactic difficulties.  3. History of cerebrovascular disease and older ischemic strokes.  4. Frequent urinary tract infections.  5. Coronary artery disease.  6. Hypertension.  7. Hyperlipidemia.  8. Moderate aortic stenosis.  9. Type 2 diabetes mellitus.  10.Noncompliance with medications, probably secondary to dementia -      psychosis.   The patient was brought to the hospital as I understand by private  vehicle.  Her weight is 68 kg at a height of 5 feet 4 inches,  temperature is 97 degrees Fahrenheit, pulse rate 77, diastolic blood  pressure is  73 and systolic blood pressure is 109.  The patient is  cooperative to a point.  She states that she is a nonsmoker, nondrinker,  has no history of any drug abuse, does not feel any pain nor in any  distress.  The eye deviation has bothered her, but is painless and she  has no preceding trauma that she can recall.  She had no recent febrile  illness, nausea, vomiting, or falls.  She has taken medication regularly  she insists.  I have only a discharge summary from a May 2009 admission  here.  At that time, the patient was on enalapril, glipizide,  metoprolol, Seroquel, simvastatin, lorazepam, Aricept, aspirin 325 mg,  Zocor 40 mg.  She states her primary care doctor is Dr. Shana Chute and she  has named her eye surgeon, Dr. Nile Riggs as a secondary physician.   Today's metabolic panel shows an ionized calcium of 1.14.  Hemoglobin of  10.2 which is low, hematocrit 30, sodium is high at 142, potassium is  borderline low at 3.3,  chloride 104, glucose 140, BUN 8, creatinine is  1.0, pCO2 is 27.  CT shows no acute new lesions.  She lists no known  drug allergies.   The patient is not in acute distress.  She does have S2 heart murmur.  She has normal peripheral pulses.  No bruit.  No clubbing, no cyanosis,  no edema.  No abdominal tenderness.  No skin bruising.  Her blood  pressure is 128/68.  Her heart rate is 18, respiratory rate is 14, O2  saturation is 94% on room air.  She has a Mallampati grade B.  Neurologically, she is of normal nutritional status.  She is alert and  oriented, but she is not necessarily insightful.  Her mental status is  marked by a slight dysarthria, very mild so and by her inability to  recognize or judge the risk of a second stroke if she does not undergo  any TIA workup today.  She is oriented to place, person, and time.  She  insists that Dr. Nile Riggs is her family doctor.  She insists Dr. Shana Chute  has seen her for strokes in the past and has not found her at any risk   of having any more.  She does not recall when she had her last CAT scan  or MRI of the brain, but remembers that she just an hour ago had one of  the CT scans.  She is unwilling to stay for an MRI/MRA study today.  The  last MRA of the brain listed by e-chart was in June 2007 and showed a  sequelae of right mid temporal and temporoparietal ischemic infarct  associated with gliosis, encephalomalacia, and lacunar infarcts in the  semiovale on the right and left paramedian pons.  Again, these pontine  locations may at this time also be the cause for her diplopia.  The  cranial nerve examination shows a mild left-sided facial droop, very  mild and the patient is able to smile fairly equally, close her eyes  tightly, move her eyebrows bilaterally.  She denies any sensory loss  over the face.  Her extraocular movements show no ptosis.  First of all,  she can cross with her right eye through the right lateral visual field,  but does not cross the midline and adducts.  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  She also cannot easily follow a moving  object with her left eye over the midline towards the left side.  This  would normally be seen in an internuclear ophthalmoplegia, yet she does  not have pupillary asymmetry.  The neck is supple.  The patient provides  bilateral pretty good grip strengths.  She has a slight pronator drift  on the left.  She does not have upgoing toes on either side.  She feels  fine touch and pinprick on arms and legs bilaterally.  She elevated all  extremities against gravity.  She could name and repeat.  Her NIH stroke  scale is 2.   At this time, I would want the patient to stay in the Critical Decision  Making Unit.  I think she may be admitted should the MRI returned with  positive findings of stroke.  Otherwise, she can undergo a TIA workup by  the [facility] Stroke Service tomorrow.  My wish is that she be admitted  to Dr. Magda Kiel service and that  the Stroke Service may consult if a  stroke is found.  Melvyn Novas, M.D.  Electronically Signed     CD/MEDQ  D:  12/27/2008  T:  12/28/2008  Job:  [id number]   cc:   Nelva Nay, MD

## 2010-09-24 NOTE — H&P (Signed)
Sara [name], Sara [name]                ACCOUNT NO.:  [id number]   MEDICAL RECORD NO.:  [id number]          PATIENT TYPE:  INP   LOCATION:  3035                         FACILITY:  [facility]   PHYSICIAN:  Virgie Dad, MD     DATE OF BIRTH:  [birth year]   DATE OF ADMISSION:  12/27/2008  DATE OF DISCHARGE:                              HISTORY & PHYSICAL   PRIMARY CARE PHYSICIAN:  Unassigned.   CHIEF COMPLAINT:  Recurrent fall, right eye acting funny.  The patient  complains of double vision.   HISTORY OF PRESENT ILLNESS:  This is a 77 year old African American  female, known Alzheimer's, known diabetes, known murmur of aortic  stenosis, hypertension, past history of myocardial infarction, and  previous CVA.    CANCELLED DICTATION      Virgie Dad, MD     EA/MEDQ  D:  12/27/2008  T:  12/28/2008  Job:  [id number]

## 2010-09-24 NOTE — Consult Note (Signed)
NAMEKERSTIE, AGENT                ACCOUNT NO.:  [id number]   MEDICAL RECORD NO.:  [id number]          PATIENT TYPE:  INP   LOCATION:  3035                         FACILITY:  [facility]   PHYSICIAN:  Janetta Hora. Fields, MD  DATE OF BIRTH:  21-Feb-1930   DATE OF CONSULTATION:  01/01/2009  DATE OF DISCHARGE:                                 CONSULTATION   REQUESTING PHYSICIAN:  [facility].   REASON FOR CONSULTATION:  Left internal carotid artery stenosis with  recent stroke.   HISTORY OF PRESENT ILLNESS:  The patient is a 77 year old female with  recent left brain stroke.  This presented with right lateral gaze.  She  was admitted approximately 5 days ago.  She has essentially returned to  her baseline at this point.  She has a history of a remote right lacunar  stroke.  She also has a history of mild-to-moderate dementia.  During  this hospital admission, she was found to have a left pontine infarct.  Carotid duplex scan and CT angiogram showed a high-grade left internal  carotid artery stenosis and a right common carotid artery occlusion.  Left internal carotid artery stenosis was greater than 80% on duplex.  It was approximately 70% on CTA.  She was not on aspirin prior to  admission.  Atherosclerotic risk factors include hypertension, elevated  cholesterol, and coronary artery disease.   PAST MEDICAL HISTORY:  1. Diabetes.  2. Aortic stenosis.  3. Myocardial infarction.   PAST SURGICAL HISTORY:  Carpal tunnel release bilaterally,  cholecystectomy.   MEDICATIONS:  Aggrenox, Lovenox, Zocor, Glucotrol, Aricept, lisinopril,  and Norvasc.   ALLERGIES:  She has no known drug allergies.   FAMILY HISTORY:  Unremarkable.   REVIEW OF SYSTEMS:  Not reliable secondary to the patient's dementia.  She is alert and oriented to her name and sometimes to place.  She is  not very well oriented to time or current situation.  Her daughter was  present for the interview.   The  patient is able to get around and was living at home with her son  prior to admission to the hospital.  She is fairly functional overall.   PHYSICAL EXAMINATION:  VITAL SIGNS:  Blood pressure is 155/67, heart  rate 82 and regular.  GENERAL:  She is alert, oriented x1 to person, place as mentioned above.  HEENT:  She has poor dentition.  NECK:  She has 2+ carotid pulses with bilateral bruits.  CHEST:  Clear to auscultation.  CARDIAC:  Regular rate and rhythm with a 3/6 systolic murmur.  ABDOMEN:  Soft, nontender, and nondistended.  No masses.  EXTREMITIES:  She has 2+ femoral and 2+ radial pulses bilaterally.  NEUROLOGIC:  She has symmetric upper extremity and lower extremity motor  strength which is 5/5.  Extraocular movements are intact.   IMAGING:  CT angio of the neck dated December 30, 2008 shows chronic  occlusion of the right common carotid artery at the level of the  bifurcation with heavy calcification.  The internal and external carotid  arteries appear to refill from collaterals.  Plaque on the left carotid  bifurcation is also heavily calcified but thought to be approximately  70% narrowing of the left internal carotid artery.  Vertebral arteries  were patent without stenosis bilaterally.   In summary, Ms. Shampine has a right carotid occlusion with high-grade  left internal carotid artery stenosis.  These are probably not a cause  of her chronic deep brain infarct but certainly she is at high risk of  stroke long-term secondary to this carotid occlusive disease.  She was  previously been seen by Dr. Pearlean Brownie, the neurologist who think she would  be a reasonable candidate for carotid stenting.  I agree with this.  I  will discuss with Dr. Pearlean Brownie tomorrow the timing and staging of her  proximal carotid stenting procedure.  Procedure details were roughly  outlined with the patient's daughter this evening.  She understands and  there is agreement to proceed once the date is picked.   After discussing  this with Dr. Pearlean Brownie tomorrow, we will make arrangements most likely to  go ahead and convert her over to Plavix as she will be ready for carotid  stenting and will need to have this circulating preoperatively.      Janetta Hora. Fields, MD  Electronically Signed     CEF/MEDQ  D:  01/01/2009  T:  01/02/2009  Job:  [id number]

## 2010-09-27 NOTE — Discharge Summary (Signed)
Sara [name], [name]                ACCOUNT NO.:  [id number]   MEDICAL RECORD NO.:  [id number]          PATIENT TYPE:  INP   LOCATION:  3741                         FACILITY:  [facility]   PHYSICIAN:  Melissa L. Ladona Ridgel, MD  DATE OF BIRTH:  [birth year]   DATE OF ADMISSION:  11/30/2004  DATE OF DISCHARGE:  12/10/2004                                 DISCHARGE SUMMARY   DISCHARGE DIAGNOSES:  1.  Non-ST elevation myocardial infarction:  The patient was treated      conservatively with aspirin, beta blocker and Statin in light of the      fact that she had a comorbid stroke. A 2-D echocardiogram was      undertaken, results are to follow. The patient also has a comorbid      aortic stenosis and severe stenosis of the left internal carotid.      Catheterization and Cardiolite stress testing were considered; however,      in light of the patient's cerebrovascular accident, it was decided that      she should be managed medically at this time.  2.  Significant bilateral carotid artery stenosis: The patient was evaluated      by CVTS and cardiology as well as neurology. It was decided that the      patient would follow-up with CVTS, Dr. Edilia Bo in six weeks for      reevaluation at this time. She will be maintained on Aggrenox as an      outpatient.  3.  Cerebrovascular accident with right upper extremity hemiparesis:  The      patient remains on Aggrenox and will be treated conservatively. Risk      factors such as diabetes and lipids will be treated. She will need PT      and OT as an outpatient for the right upper extremity hemiparesis.  4.  Dementia and anxiety:  The patient has been continued on her Xanax and      started on Aricept 5 milligrams p.o. q.h.s.  5.  Hypercholesterolemia:  The patient has been maintained on Lipitor 80      milligrams daily.  6.  Hyperhomocysteinemia:  She is will be continued on Foltx 1 tablet p.o.      daily.  7.  Urinary tract infection:  The patient will  complete a course of Cipro      500 milligrams p.o. b.i.d. on December 14, 2004.  8.  Diabetes:  She has been maintained on Lantus 12 units q.12h. and sliding      scale insulin with moderate control of her diabetes. Complete blood      glucoses ranging from 110-150 and 195 at their highest. I elected to      discontinue her metformin in order to obtain better control of her blood      sugars.   MEDICATIONS:  1.  Xanax 0.25 milligrams p.o. daily.  2.  Lipitor 80 milligrams p.o. q.d. q.h.s.  3.  Aggrenox 1 capsule p.o. b.i.d.  4.  Foltx 1 tablet p.o. daily.  5.  Aricept 5 milligrams p.o. q.h.s.  6.  Isosorbide mononitrate 30 milligrams p.o. daily.  7.  Lasix 20 milligrams p.o. daily.  8.  Lopressor 25 milligrams p.o. q.8h. hold for systolic blood pressure less      than 120 or heart rate less than 60.  9.  Cipro 500 milligrams p.o. q.12h. until August 5,2006.  10. Insulin sliding scale with NovoLog CBG of 60-100 0 units; CBG of 101-150      with 3 units; CBC 150-200 with 4 units; CBG of 201-250 with 8 units; CBG      of 251-300 with 12 units; CBG of 301-350 with 16 units and greater than      350 20 units and call a doctor.  11. Multivitamin 1 capsule daily.  12. Lantus 12 units subcutaneous q.12h.  13. Tylenol 650 milligrams p.o. q.4h. p.r.n.  14. Xanax 0.25 milligrams q.8h. p.r.n.   HISTORY OF PRESENT ILLNESS:  The patient is 77 year old African-American  female with past medical history significant for CVA, dementia,  hypertension, diabetes. The patient's family was unable to contact her for  three days. They took the police to her home and found her lying on the  floor upstairs in her own urine. The patient at the time had noted  complaints of chest pain and shortness of breath. She states that she fell  when she was trying to clean her bedroom. The patient was brought to the  emergency room for further evaluation where she was found to have a right  upper extremity hemiparesis  consistent with possible stroke. The patient was  admitted to the hospital for treatment of hyponatremia, UTI and the probable  stroke. She was started on aspirin and a neurology consult was called. The  patient was admitted to telemetry where she was found to have elevated  cardiac enzymes as well as elevated white count of 23.8. She was started on  Cipro I.V. The patient was consulted on by neurology, cardiology and  cardiovascular vascular and thoracic surgeons. Workup of her stroke  identified bilateral internal carotid artery disease showing a 90% focal  stenosis of the distal left common carotid, patent right internal carotid  was noted; however, there was some plaque seen on carotid ultrasound. CVTS  has elected to follow up the patient in six weeks based on her complicated  current medical history and her risk for intervention at this time.  Cardiology also deemed the patient a high risk for further evaluation with  either cath or Cardiolite stress test and have elected to medically manage  her at this time. Of note, the patient's 2-D echocardiogram showed preserved  left ventricular function with EF of 55-65%. There is moderate aortic  valvular regurgitation, moderate mitral valvular regurgitation and increased  left wall thickness.   On the day prior to discharge, the patient remained hemodynamically stable.  She continues to be treated for her UTI with oral Cipro which will finish on  December 14, 2004.   PHYSICAL EXAMINATION:  VITAL SIGNS:  Her vital signs were temperature of  98.1, blood pressure 137/57, pulse of 75, respirations 17. Blood glucoses  ranging from 119-195 max. Saturation 97%.  GENERAL:  This is a well-developed, well-nourished African-American female  in no acute distress. She is very pleasant.  HEENT:  Pupils equal, round, and reactive to light. Extraocular muscles  intact. Moist mucus membranes. NECK:  Supple. There is no JVD, no lymph nodes and no carotid  bruits.  CHEST:  Clear to auscultation. There is no rhonchi, rales, or  wheezes.  CARDIOVASCULAR:  Regular rate and rhythm, positive S1 and S2. She has a loud  2/6 systolic ejection murmur in the left sternal border and right  intercostal space.  ABDOMEN:  Abdomen is soft, nontender, nondistended with positive bowel  sounds.  EXTREMITIES:  Show right upper extremity hemiparesis with 1+ edema. She has  minimal rash that has improved over the course of the stay. All other motor  functions are intact. The patient is able to ambulate without dysfunction.  However, the loss of the right upper extremity does cause some imbalance at  times.  NEUROLOGICAL:  The patient does display signs of dementia but is easily  oriented and very pleasant.   LABORATORY DATA:  Pertinent laboratory values over the course of hospital  stay reveal a sodium of 133, potassium 3.7, CO2 of 24, glucose of 83, BUN of  10, creatinine 0.6, calcium is 9.2. Her CBC reveals a white count of 10.7,  hemoglobin of 10.7, hematocrit 31.8, and platelets of 314,000. Urine culture  is growing E. coli sensitive to Cipro and ceftriaxone. The patient also grew  E. coli from her blood cultures that were sensitive to Cipro as well  ceftriaxone. The patient has had no further temperature spikes or elevated  white counts. Her TSH is 2.328. Vitamin B12 is 568, hemoglobin A1c was 6.9.  Homocystine was 22.9. Lipid panel revealed cholesterol 164, triglycerides of  161. Cholesterol HDL of 36, LDL of 96.   The patient is being evaluated for skilled nursing facility placement and a  further addendum to this discharge summary will be added at the time of  discharge.       MLT/MEDQ  D:  12/09/2004  T:  12/10/2004  Job:  [id number]

## 2010-09-27 NOTE — Consult Note (Signed)
Sara, [name]:  [id number]   MEDICAL RECORD NO.:  [id number]          PATIENT TYPE:  INP   LOCATION:  3741                         FACILITY:  [facility]   PHYSICIAN:  Di Kindle. Edilia Bo, M.D.DATE OF BIRTH:  1929/08/06   DATE OF CONSULTATION:  12/03/2004  DATE OF DISCHARGE:                                   CONSULTATION   REASON FOR CONSULTATION:  Bilateral carotid disease.   HISTORY:  Of note, obtaining the history from the chart is this patient has  significant dementia and I am really unable to get any reliable history from  the patient herself. She is convinced that she is at home and it was with  great difficulty that she let me talk to her and examine her. According to  records, however, she was admitted on December 01, 2004 after she had been found  at home. The family had been unable to contact her for three days. When they  eventually got into the house, she was found on the floor and was unable to  move her right arm. She was brought to the emergency room and it was felt  that she had likely had a stroke. In addition, she was noted to have  increased troponins and cardiology has evaluated the patient and it is felt  that she has likely had a subacute non-ST MI. There was also some concern  that she had some rhabdomyolysis from being down at home for so long. Again,  I could not get any reliable history from the patient so I am not sure if  she has had previous history of stroke, TIAs, expressive or receptive  aphasia. Clearly at this time, she had profound right arm weakness.   PAST MEDICAL HISTORY:  1.  Significant for type 2 diabetes.  2.  Dementia.  3.  Hypertension.   The social history and family history are unobtainable from the patient.   CURRENT MEDICATIONS:  Include aspirin, hydrochlorothiazide, nifedipine ER,  Diovan, Lipitor, metformin, lorazepam, hydroxyzine, alprazolam.   REVIEW OF SYSTEMS:  She denies any recent chest pain,  chest pressure. Review  of systems is otherwise documented in her admission history and physical.   Blood pressure is 118/68, heart rate is 72. I do not detect any carotid  bruits. Lungs were clear bilaterally to auscultation. Cardiac exam she has a  regular rate and rhythm with a systolic murmur. Abdomen is soft and  nontender. She has palpable femoral pulses. Both feet are warm and  adequately perfused. She has profound right upper extremity weakness with  really no strength in the right upper extremity. She has good strength in  the right lower extremity and normal strength on the left.   I have reviewed her carotid duplex scan. She has evidence of possible right  internal carotid artery occlusion. The left internal carotid artery has a 60-  79% stenosis. End-diastolic velocity on the left is 84 cm/sec consistent  with a less than 80% left carotid stenosis. She has had an MRI which showed  no definite new infarct with some bilateral old subcortical  lesions. The  patient is apparently scheduled for MRA of the neck.   This patient appears to have had a left hemispheric stroke associated with  right arm weakness. She does have a 60-79% left carotid stenosis and  normally might be considered for left carotid endarterectomy once she  recovered from her stroke. However, I think she is currently very poor  candidate to be considered for surgery given her history of significant  dementia, her subacute non-ST MI, and other medical issues including  transient elevated creatinine and white count. I will follow for now but  likely see her back in the office in 6-8 weeks to reevaluate her but  currently I do not think she is a candidate for left carotid endarterectomy.  We could reconsider this if she shows some significant improvement in the  next 6-8 weeks.       CSD/MEDQ  D:  12/03/2004  T:  12/04/2004  Job:  [id number]

## 2010-09-27 NOTE — H&P (Signed)
NAMERAGNA, KRAMLICH NO.:  [id number]   MEDICAL RECORD NO.:  [id number]          PATIENT TYPE:  INP   LOCATION:  3102                         FACILITY:  [facility]   PHYSICIAN:  Ladell Pier, M.D.   DATE OF BIRTH:  01/09/1930   DATE OF ADMISSION:  11/30/2004  DATE OF DISCHARGE:                                HISTORY & PHYSICAL   HISTORY OF PRESENT ILLNESS:  The patient is a 77 year old African-American  female with past medical history significant for cerebrovascular accident,  dementia, hypertension, diabetes. Per daughter, they have not been able to  contact patient for approximately 3 days. They finally called the police and  went over to the house. The patient was found lying on the floor upstairs in  her urine. She has no complaints of chest pain or shortness of breath. The  patient denies all this. She states that she has been fine. She states that  she fell and hit her right shoulder when she tried to clean her bedroom. The  family states that over the past year, patient's memory problem has been  progressively getting worse.   PAST MEDICAL HISTORY:  1.  Hypertension.  2.  Diabetes.  3.  Dyslipidemia.  4.  Anxiety.  5.  Dementia.   FAMILY HISTORY:  Mother died of old age. Father died of questionable  etiology.   SOCIAL HISTORY:  She does not smoke. Does not drink. She is divorced with 4  children living, two died. 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   MEDICATIONS:  She takes Diovan, Lipitor, hydrochlorothiazide, Glucophage,  aspirin and Lorazepam.   ALLERGIES:  No known drug allergies.   REVIEW OF SYSTEMS:  As first stated in the HPI. She does complain of some  right shoulder pain and dry mouth but no chest pain. No shortness of breath.   PHYSICAL EXAMINATION:  VITAL SIGNS:  Temperature 98.7, blood pressure  138/59, pulse of 99, respiratory rate 12, pulse ox 98%.  HEENT:  Normocephalic and atraumatic. Pupils are  equal, round, and reactive  to light. Throat without erythema. She has poor dentition.  CARDIOVASCULAR:  Regular rate and rhythm with 2 out of 6 systolic murmur,  radiating to the carotids.  LUNGS:  Clear to auscultation bilaterally. No wheezes, rhonchi.  ABDOMEN:  Soft, nontender, and nondistended. Positive bowel sounds.  EXTREMITIES:  Without edema.  NEUROLOGIC:  Cranial nerves 2-12 are intact. Strength 5 out of 5 in the left  upper, left lower, right lower extremities and in the right upper extremity  strength is 1 out of 5. Deep tendon reflexes 1% symmetric. She is oriented  to place. She thinks that the year is 50.   LABORATORY DATA:  Sodium 121, potassium 3.4, chloride 91, CO2 18, BUN 41,  glucose 106, white blood cell count 23.8, hemoglobin 12.5, platelets  251,000. MCV 91.   ASSESSMENT/PLAN:  1.  Question of cerebrovascular accident. The patient had a history of      cerebrovascular accident in the past. This looks like an acute stroke  but since the onset is not sure, since she has not been seen in 3 days.      Will get a CT scan of the head, 2-D echocardiogram, carotid Doppler's,      MRI, MRA, cardiac enzymes. Start on aspirin. Consult neurology in the      morning.  2.  Diabetes. Will hold p.o. medications and cover with sliding scale      insulin.  3.  Hypertension. Hold her p.o. medications as well as do Lopressor p.r.n.      for blood pressure.  4.  Dyslipidemia. Will check fasting lipid panel.  5.  Urinary tract infection. Start on Ciprofloxacin intravenously b.i.d.  6.  Hypokalemia. Replete and check magnesium level.  7.  Leukocytosis secondary to infection.  8.  Hyponatremia. Will give intravenous fluids, check urinary sodium and      creatinine. Calculate the fractionated execution of sodium.       NJ/MEDQ  D:  12/01/2004  T:  12/01/2004  Job:  [id number]   cc:   Theone Stanley, MD

## 2010-09-27 NOTE — Op Note (Signed)
[facility]  Patient:    Sara [name], Sara [name]                      MRN: [id number] Proc. Date: 07/12/99 Attending:  Sharyn Dross., M.D. CC:         Jethro Bastos, M.D.                           Operative Report  PREOPERATIVE DIAGNOSIS:  Family history of colon cancer.  POSTOPERATIVE DIAGNOSIS:  Normal colonoscopic examination to the cecum.  Slight  stricture possibly secondary to positional load located around the splenic flexure region.  OPERATION:  Colonoscopy.  SURGEON:  Sharyn Dross., M.D.  PREMEDICATION:  Demerol 50 mg IV and Versed 5 mg IV over a 10 minute period of time.  INSTRUMENTS:  The Olympus video colonoscope.  INDICATION:  This pleasant 77 year old female was brought in for an evaluation regarding a strong family history of colon cancer.  The patient is relatively stable without any major complaints but it was advised via the patients physician that she gets evaluated at this time.  She initially was scheduled for July 04, 1998, but due to complications that occurred, she had to change her schedule to  today.  PHYSICAL EXAMINATION:  GENERAL:  She was a pleasant female in no distress.  VITAL SIGNS:  Stable.  HEENT:  Anicteric.  NECK:  Supple.  LUNGS:  Clear.  HEART:  Regular rate and rhythm without heaves, thrills, murmurs, or gallops.  ABDOMEN:  Soft.  No tenderness.  No hepatosplenomegaly appreciated.  EXTREMITIES:  Grossly within normal limits.  PLAN:  Proceed with the colonoscopic examination.  INFORMED CONSENT:  The patient was advised of the procedure, the indications, and the risks involved.  A video was reviewed and consent form was obtained.  DESCRIPTION OF PROCEDURE:  The patient was brought in the endoscopy unit where n IV for IV sedating medication was started.  A monitor was placed on the patient to monitor the patients vital signs and oxygen saturation.  Nasal oxygen at two liters  per minute was used.  After adequate sedation was performed, the procedure was begun.  The patient was given GoLYTELY and Reglan as a bowel prep.  The patient tolerated the prep well without any complications.  The quality of the prep was excellent.  The instrument was advanced to the patient lying in the left lateral position to approximately 100 cm proximal to the proximal ascending colon region.  There appeared to be no gross abnormalities such as masses or polyps appreciated. However, there was areas of a stricture that was noted around the splenic flexure region at this time.  There was no mass or firm lesion within this region at this time and upon retracting the instrument back there was no evidence of a stricture noted.  This was possibly positioning and some tortuosity with the advancement f the instrument that was noted.  Otherwise there were no other abnormalities noted.  The vascular pattern appeared to be well within normal limits throughout the entire colon and the mucosal pattern showed no evidence of diverticular disease that was present.  There appeared to be no abnormalities within the cecal pouch that was appreciated, although the instrument was not able to advance within this region.  With manipulation of the area, no masses or abnormalities could be appreciated.  The instrument was subsequently retracted back and no evidence  of internal or hemorrhoids noted.  TREATMENT:  Conservative management at this time.  Would recommend repeating the procedure in approximately two years to insure no other abnormalities noted at his time. DD:  07/12/99 TD:  07/14/99 Job: [id number]

## 2010-09-27 NOTE — Discharge Summary (Signed)
NAMEMARRIAN, Sara [name]:  [id number]   MEDICAL RECORD NO.:  [id number]          PATIENT TYPE:  INP   LOCATION:  3014                         FACILITY:  [facility]   PHYSICIAN:  Elliot Cousin, M.D.    DATE OF BIRTH:  April 05, 1930   DATE OF ADMISSION:  10/26/2005  DATE OF DISCHARGE:  10/30/2005                                 DISCHARGE SUMMARY   DISCHARGE DIAGNOSES:  1.  Progressive dementia with worsening psychosis.  2.  Mild ataxia with a radiographic findings of severe cerebrovascular      disease and old ischemic strokes.  3.  Urinary tract infection.  4.  Coronary artery disease/hypertension/hyperlipidemia/moderate aortic      stenosis.  5.  Type 2 diabetes mellitus.  6.  Noncompliance secondary to dementia.   SECONDARY DISCHARGE DIAGNOSES AND PAST MEDICAL HISTORY:  Please see the  dictated history and physical.   DISCHARGE MEDICATIONS:  1.  Lopressor 100 mg half a tablet twice daily (50 mg b.i.d.).  2.  Altace 2.5 mg daily.  3.  Glucophage 500 mg daily.  4.  Glipizide 5 mg daily.  5.  Aricept 5 mg at bedtime.  6.  Seroquel 25 mg half a tablet b.i.d.  7.  Centrum Silver 1 tablet daily.  8.  Aspirin 81 mg daily.  9.  Zocor 20 mg q.h.s.   DISCHARGE DISPOSITION:  The patient was discharged to home on June21,2007 in  improved and stable condition.  The family was advised to follow up with the  patient's primary care physician at [facility] in 1 week for  further evaluation and management.   PROCEDURE PERFORMED:  1.  CT scan of the head on June17,2007.  The results revealed no acute      intracranial findings, stable cortical volume loss and chronic      microvascular white matter disease, and remote infarcts.  2.  MRI of the brain on June18,2007.  The results revealed no evidence of      acute ischemia, mild atrophy, moderate severe small vessel disease,      lacunar infarct in the right centrum semiovale and also the left pons,      old  ischemic infarcts in the right temporoparietal region, mild to      moderate sinusitis changes in the ethmoid and frontal sinuses.  MRA of      the brain revealed suspicion of 50-70% stenosis of the left ICA and no      evidence of intracranial aneurysm is seen.   CONSULTATIONS:  Antonietta Breach, M.D.   HISTORY OF PRESENT ILLNESS:  The patient is a 77 year old lady with a past  medical history significant for dementia, coronary artery disease,  peripheral vascular disease, and diabetes mellitus, who presented to the  emergency department on June17,2007 with a chief complaint of dizziness and  worsening confusion.  Per the family's account, the patient also had  difficulty walking.  She was brought to the emergency department.  During  the evaluation in the emergency department, she was found to be mildly  febrile with a temperature  of 100.3, yet hemodynamically stable.  CT scan of  the head revealed no acute intracranial findings, although there was  evidence of old strokes.  The patient was therefore admitted for further  evaluation and management.   HOSPITAL COURSE:  1.  DEMENTIA WITH PSYCHOSIS/MILD ATAXIA/SEVERE CEREBROVASCULAR DISEASE.  The      patient was started on Aricept 5 mg daily.  Supportive care was given.      The patient required as-needed restraints, as she was somewhat confused      during the hospitalization.  The patient also was easily directed at      times.  She was eventually started on treatment with Seroquel, initially      12.5 mg daily.  However, it was increased to b.i.d.  Trazodone 25 mg      q.h.s. p.r.n. was also added.  For further evaluation, a number of      studies were ordered.  They included a TSH which was within normal      limits at 1.8, vitamin B12 also within normal limits at 317, folate      level within normal limits at 19.9, RPR which was nonreactive, and a      homocystine level of 14.9 which was within normal limits.  Blood      cultures  were also ordered, given the patient's low grade fever.  The      blood cultures remained negative during the hospital course.  A MRI of      the brain was ordered for evaluation of an acute stroke.  The results      are decribed above.  In essence, the MRI of the brain revealed no acute      strokes; however, there was evidence of old strokes and severe      cerebrovascular disease.  The mild ataxia that the patient demonstrated      on admission and during the hospital course probably was more likely      secondary to the cerebrovascular disease.  The patient, however,      demonstrated no severe ataxia or dysmetria.   Occupational therapy and physical therapy were both ordered during  hospitalization.  The patient was able to ambulate with a walker and with  assistance.  The therapist recommended home health physical therapy and  occupational therapy, which were ordered at the time of hospital discharge.  The patient was started on aspirin therapy 81 mg daily.  At the time of  hospital discharge, she was somewhat less confused, and the family felt that  she was somewhat better than she had been several days prior.  The patient's  daughter, Parklawn Sink, is her full time caretaker.  Roosevelt Sink also has a nursing  background.  She will continue to provide primary care for her mother.  [place] Sink  also plans to obtain a health care power of attorney.   At the time of hospital discharge, the patient's regimen consisted of  Aricept and Seroquel.  The Seroquel may need to be titrated up accordingly.  However, further management will be deferred to her primary care physician  at [facility]   1.  URINARY TRACT INFECTION.  An urinalysis was ordered at the time of      hospital admission.  It revealed WBCs and bacteria.  She was started on      ciprofloxacin intravenously q. 12 hours.  She remained on Cipro      throughout the hospitalization.  The urine culture,  however, was     negative.  At the time  of hospital discharge, the ciprofloxacin was      discontinued.  She was completely afebrile, and her white blood cell      count was normal at 5.1.   1.  CORONARY ARTERY DISEASE/HYPERTENSION/HYPERLIPIDEMIA.  The patient has      known coronary artery disease.  She was restarted on aspirin therapy, as      well as beta blocker therapy.  A fasting lipid panel was ordered and      revealed a total cholesterol of 309, triglycerides of 203, HDL      cholesterol of 46, and LDL cholesterol of 222.  The patient was started      on Zocor 20 mg q.h.s.  Apparently, the patient had been noncompliant      with medication therapy.  Her blood pressure was mildly to moderately      elevated during the hospital course.  The Lopressor was titrated up to      50 mg b.i.d. and Altace was added at 2.5 mg daily.  Further management      will be deferred to her primary care physician at [facility].   1.  TYPE 2 DIABETES MELLITUS.  The family was unaware of the exact      medication regimen the patient had been treated with in the past.      During the hospitalization, she was started on a sliding scale insulin      regimen, as well as Glucophage and glipizide.  Her hemoglobin A1c was      7.0.  The patient's capillary blood sugars were assessed q.a.c. and      q.h.s. and covered with the sliding scale insulin accordingly.  At the      time of hospital discharge, she was advised to continue Glucophage at      500 mg daily and glipizide at 5 mg daily.  Further adjustments may be      needed in the outpatient setting.   DISCHARGE DISPOSITION:  The patient was in improved and stable condition at  hospital discharge.  Home health occupational therapy and physical therapy  have been ordered.  A rolling walker and a shower chair were also ordered  for home.   ADDENDUM:  Dr. Jeanie Sewer, psychiatrist, was consulted for evaluation.  Per  his assessment, the patient did not have capacity for informed consent.   He  agreed with medical management.      Elliot Cousin, M.D.  Electronically Signed     DF/MEDQ  D:  11/03/2005  T:  11/03/2005  Job:  [id number]

## 2010-09-27 NOTE — Consult Note (Signed)
Sara [name], Sara [name]:  [id number]   MEDICAL RECORD NO.:  [id number]          PATIENT TYPE:  INP   LOCATION:  3102                         FACILITY:  [facility]   PHYSICIAN:  Sara [name], M.D. LHCDATE OF BIRTH:  1930/02/01   DATE OF CONSULTATION:  12/01/2004  DATE OF DISCHARGE:                                   CONSULTATION   PRIMARY CARE PHYSICIAN:  Unknown.   CARDIOLOGIST:  She is new to [facility].   REASON FOR CONSULTATION:  Increased troponin in the setting of acute CVA.   HISTORY OF PRESENT ILLNESS:  Sara [name] is a 77 year old woman with a  history of previous stroke, diabetes, and mild dementia, but no known  history of coronary artery disease, who is admitted to the ER secondary to  acute left brain stroke. As part of a workup she was found to have elevated  troponin and thus we were consulted.   Sara [name] is unable to give an accurate history. According to her admission  note her daughters had not been able to contact her for about three days, so  they called the police. Sara [name] was found lying on the floor in her urine  and stool. She was brought to the ER and found to have new right lower  extremity and a right-sided facial droop. She underwent a head CT that was  negative, but did show an old temporal infarct as well as some scattered  lacunar infarcts. Sara [name] denied any chest pain or shortness of breath.  She was thus admitted to the neuro ICU. She was subsequently found to have  an elevated troponin-I of 6.19 and thus we are called to consult. Her EKG  was nonacute.   REVIEW OF SYSTEMS:  Unavailable given the patient's condition.   PAST MEDICAL HISTORY:  1.  History of CVA.  2.  Diabetes mellitus.  3.  Dementia.  4.  Hypertension.   I am unable to gather any other past medical history at this time.   MEDICATIONS:  According to the ER note she is on aspirin, Ativan, Atarax,  Diovan, Lipitor, nifedipine, metformin, and  hydrochlorothiazide.   MEDICATION ALLERGIES:  She has no known drug allergies.   SOCIAL/FAMILY HISTORY:  Unavailable. I do know that she does not smoke or  drink alcohol.   PHYSICAL EXAMINATION:  Sara [name] is resting comfortably, lying nearly flat  in bed. Her respirations are unlabored. She is atrial fibrillation. Her  blood pressure is 112/41 with a heart rate of 98. She is saturating 99% on  room air.  HEENT: Sclerae anicteric. EOMI. There are no xanthelasma. Mucous membranes  are moist. She has poor dentition.  NECK: Supple. There is no JVD. Carotids are 2+ bilaterally with bilateral  bruits radiating off her aortic valve.  CARDIAC: She is tachycardic and regular. She has a 2/6 crescendo/decrescendo  mid to late peaking systolic ejection murmur at the right sternal border.  Her S2 is markedly diminished. There is no rub or gallop.  LUNGS: Clear to auscultation.  ABDOMEN: Soft, nontender, nondistended. There is no hepatosplenomegaly.  There are no bruits. There are no masses appreciated.  EXTREMITIES: Warm. There is no clubbing, cyanosis, or edema. Femoral pulses  are 2+ bilaterally without bruits. DP pulses are 1+ bilaterally.  NEUROLOGIC: She is mildly dysarthric. She is alert and oriented times two.  She tells me the year is 62.  She has very mild right facial droop. She is  markedly weak in the right upper extremity, 1/5. She has an upgoing right  Babinski. Otherwise, she has normal strength and her Babinski is down on the  left.   LABORATORY DATA:  Sodium 124, potassium 3.5, chloride 89, bicarbonate 16,  BUN 48, creatinine 1.6, anion gap 19, glucose 132. Notably her creatinine  was 0.8 in March 2006. White count is 23.8, hematocrit 36.7%, platelet count  251,000. AST is 140, ALT 63, alkaline phosphatase 134,000, bilirubin 1.5.  Total CK is 2366 with MB fraction of 30, troponin-I 6.19. Albumin 2.8, INR  1.0.   Head CT, noncontrast, shows an old temporal stroke with no  acute changes.  There are scattered lacunar infarcts. EKG shows sinus tachycardia of 100,  with questionable old septal infarct. There is a biphasic T-wave in V3, but  no other significant ST-T wave changes.   ASSESSMENT/PLAN:  Sara [name] is an unfortunate 77 year old woman as above  who is now admitted with acute stroke complicated by prolonged downtime,  rhabdomyolysis, metabolic acidosis, and acute renal insufficiency.   Given her elevated troponin in the setting of a normal MB fraction, this is  likely a subacute non-ST elevation myocardial infarction.  Physical  examination is also notable for aortic stenosis which is probably at least  moderate.   At this point would recommend:  1.  Treatment with aspirin, beta blocker, and Statin. Would hold      anticoagulation at this time as she is not symptomatic and do not want      to run the risk of converting her stroke to hemorrhagic infarct.  2.  Check a 2-D echocardiogram to evaluate her LV function and degree of      aortic stenosis.  3.  Rule out myocardial infarction on telemetry.  4.  Continue to treat underlying illnesses including her stroke,      rhabdomyolysis, acute renal insufficiency, and urinary tract infection      as you are doing.  5.  She will likely need cardiac catheterization in a few days, but this      will depend on her progress.  6.  Hold metformin.   We appreciate the consult and will continue to follow with you.       DB/MEDQ  D:  12/01/2004  T:  12/01/2004  Job:  [id number]

## 2010-09-27 NOTE — Op Note (Signed)
[place]  Patient:    Sara [name], Sara [name]                       MRN: [id number] Proc. Date: 11/26/00 Adm. Date:  [id number] Attending:  Randolm Idol                           Operative Report  PREOPERATIVE DIAGNOSIS: 1. Rotator cuff tear, left shoulder with impingement. 2. Degenerative joint disease, acromioclavicular joint, left shoulder. 3. Left carpal tunnel syndrome. 4. Left ring trigger finger.  POSTOPERATIVE DIAGNOSIS: 1. Rotator cuff tear, left shoulder with impingement. 2. Degenerative joint disease, acromioclavicular joint, left shoulder. 3. Left carpal tunnel syndrome. 4. Left ring trigger finger.  OPERATION PERFORMED: 1. Open rotator cuff tear repair with acromioplasty. 2. Resection of distal clavicle. 3. Release of volar carpal ligament, left wrist with decompression of median    nerve. 4. Release A1 pulley of left ring finger for decompression of triggering.  SURGEON:  Claude Manges. Cleophas Dunker, M.D.  ASSISTANT:  Jamelle Rushing, P.A.  ANESTHESIA:  General orotracheal with supplemental interscalene block.  COMPLICATIONS:  None.  INDICATIONS FOR PROCEDURE:  The patient is a 77 year old female has had a left-sided bilateral carpal tunnel syndrome with previous right carpal tunnel release with excellent results.  She has had difficulty with numbness and tingling in the radial three digits of her left hand recently with "dropping objects" as well as triggering of the ring finger.  She has also developed pain in the left shoulder and is status post right rotator cuff tear repair. She has had a recent shoulder arthrogram revealing a complete rotator tear. She has had difficulty with overhead motions and pain and thus is to have exploration of her left shoulder and repair of the rotator cuff, acromioplasty, and distal clavicle resection as well as release of the left carpal tunnel and triggering of the ring finger.  DESCRIPTION  OF PROCEDURE:  With the patient comfortable on the operating room table and under general orotracheal anesthesia, the patient was placed in a semisitting position with the shoulder frame.  A preoperative interscalene block had been applied and the patient was quite comfortable.  The entire left upper extremity was prepped with DuraPrep from the tips of the fingers to the base of the neck.  Sterile draping was performed.  Stockinet was applied to the extremity.  The initial procedure was performed on the shoulder.  An incision was made longitudinally from the acromioclavicular joint coursing over the anterior aspect of the deltoid muscle, approximately 2 inches in length via sharp dissection.  The incision was carried down to the subcutaneous tissue.  Small bleeders were Bovie coagulated.  The acromioclavicular joint was identified as the capsule was incised and the incision carried further distally through the deltoid fascia.  Self-retaining retractor was inserted.  There was considerable debris at the [facility] joint with degenerative meniscal and cartilage tissue.  This was removed with a rongeur. Distal clavicle resection was performed with the oscillating saw.  Bone wax was applied to the bleeding bone surface.  There was no evidence of impingement beneath the remaining clavicle.  An anterior inferior acromioplasty was performed.  There was considerable thickness of the acromion as well as anterior spurring obviously causing impingement.  The acromioplasty was finished with a rasp and bone wax was applied.  The rotator cuff was then evaluated.  There was about an inch and  a half tear at the attachment of the supraspinatus and infraspinatus tendon at the humeral head.  There were osteophytes along the humeral head which were removed with a rongeur.  The joint was explored.  I did not see any chondromalacia but I did not feel or visualize a biceps tendon.  A bony trough was made laterally  in the area of the rotator cuff tear.  Cuff material on both sides of the tear was debrided with a 15 blade knife and a scissors such that it had nice sharp bleeding edges.  There was delamination of the tendon which was also debrided.  One Mitek anchor was inserted and then more posteriorly, the repair was performed cuff to cuff using 0 Tycron suture.  I had an excellent repair. There was no evidence of any remaining impingement with the arm in abduction, internal or external rotation.  The joint was then copiously irrigated with saline solution.  The deltoid fascia was closed with a running 0 Vicryl with excellent repair.  The subcutaneous was closed with 2-0 Vicryl, skin closed with skin clips.  0.25% Marcaine without epinephrine was injected into the wound.  A sterile tourniquet was then applied to the left arm.  The stockinet was removed and the left hand prepared for release of the trigger finger and carpal tunnel.  A lead hand was utilized.  About an inch incision was  made along the longitudinal palmar crease.  The arm had been elevated and Esmarch exsanguinated with the tourniquet at 250 mmHg.  Self-retaining retractor was inserted.  By blunt dissection the soft tissue was elevated off the palmaris longus which was separated longitudinally.  The distal extent of the volar carpal ligament was identified.  A Glorious Peach was placed beneath it to protect the median nerve.  There was a very well-developed palmaris brevis muscle and a very thickened volar carpal ligament.  I carefully incised both structures along their ulnar border, completed decompressing the nerve along that level. The retractor was placed beneath the wrist so that I could visualize the remainder of the volar carpal ligament and under direct visualization, it was released with a blunt tipped scissor.  The nerve was pale.  I did not see a median vein but on complete decompression it was reconstituted.  The  motor recurrent branch was intact.  I did not see any evidence of chronic tenosynovitis.  The wound was then irrigated.  The skin was closed with 4-0 Ethilon.   A transverse incision was made along the transverse palmar crease at the level of the ring finger.  By blunt dissection, the soft tissue was then elevated off the flexor tendon.  A small incision was made in the sheath and the sheath and A-1 pulley were then released proximally and distally such that there was no further triggering of the finger.  I could visualize complete release.  The wound was irrigated with saline solution.  The skin was closed with 4-0 Ethilon.  0.25% Marcaine without epinephrine was injected along the carpal tunnel and trigger finger incisions.  A sterile bulky dressing was applied followed by a posterior splint and an Ace bandage.  The tourniquet was deflated with immediate capillary refill to her fingers.  A sterile bulky dressing had been applied to the shoulder.  A sling was applied.  The patient was awakened and returned to the post anesthesia care unit without complications.  PLAN:  [facility]  Discharge in the morning.  Office Monday. Percocet for pain.DD:  11/26/00 TD:  11/26/00 Job: [id number]

## 2010-09-27 NOTE — Consult Note (Signed)
NAMEAYLAH, YEARY NO.:  [id number]   MEDICAL RECORD NO.:  [id number]          PATIENT TYPE:  INP   LOCATION:  3014                         FACILITY:  [facility]   PHYSICIAN:  Antonietta Breach, M.D.  DATE OF BIRTH:  1930-05-05   DATE OF CONSULTATION:  10/27/2005  DATE OF DISCHARGE:  10/30/2005                                   CONSULTATION   REASON FOR THE REQUEST:  Psychosis.   Mrs. Ravan is a 77 year old divorced female admitted on the 17th of June  due to acute mental status changes.  The patient refused to go back to her  home because she could hear voices and people there, which the family could  not see.  The patient has been having delusions that her ex-husband came to  her home and stole her license plate off of her car.  The daughter had  removed the license plate due to fear of the patient driving while having a  dementia condition.   The patient has had a progressive drop in her memory and forgot how to put  her car into drive.  At one point, she sat in her car with it running in the  middle of the street.  The patient has continued to be confused and has been  refusing some important treatment.   PAST PSYCHIATRIC HISTORY:  The patient has a history of progressive decline  in memory and executive function and previously acquired skills.  Noncompliance with important medical treatment has been an ongoing issue.   There is no known history of functional psychiatric illness.   FAMILY PSYCHIATRIC HISTORY:  None.   SOCIAL HISTORY:  The patient lives at home with her daughter.  There are 4  grandchildren in the home, as well.  The patient has been divorced for many  years.  She has 4 children; 2 sons and 2 daughters.  Patient has no  healthcare power of attorney.   OCCUPATION:  She worked at [employer] and at [employer] in various positions.   ALCOHOL AND ILLEGAL DRUGS:  None.   RELIGION:  SDA.   GENERAL MEDICAL HISTORY:  CAD with history of MI.   MEDICATIONS:  MAR reviewed.  Psychotropics include Haldol 1-2 mg q.4 h.  p.r.n. severe agitation.  THE PATIENT HAS NO KNOWN DRUG ALLERGIES.   LABORATORY DATA:  Glucose is 159.  The liver function tests are within  normal limits.  TSH is normal at 1.9.  B12 317, folate 19.4.   REVIEW OF SYSTEMS:  Review of systems is acquired by record only.  CONSTITUTIONAL:  Afebrile.  HEAD:  No known trauma.  EYES:  No new visual  disturbances.  ENT:  No sore throat reported.  RESPIRATION:  No cough.  CARDIOVASCULAR:  The patient does have a history of CAD and an MI.  GASTROINTESTINAL:  No nausea, vomiting, or diarrhea.  GENITOURINARY:  No  dysuria or urinary tract infections current.  HEMATOLOGIC/LYMPHATIC:  Unremarkable.  ENDOCRINE:  History of diabetes mellitus.  NEUROLOGIC:  History of cerebrovascular disease and a cerebrovascular accident.  SKIN:  Unremarkable.  MUSCULOSKELETAL:  No deformities, weaknesses, or atrophy.   EXAMINATION:  VITAL SIGNS:  Temperature 97.8, pulse 54, respirations 20,  blood pressure 151/60, O2 saturation is 96% on room air.  MENTAL STATUS EXAM:  Mrs. Emerick is alert.  She is cooperative.  She is only  oriented to the self and she thinks she is at a friend's home.  Her speech  is non-pressured, but see thought process below.  She is maintaining good  eye contact, sitting up partially in her hospital bed.  Thought process:  Rambling and tangential, normal rate.  Concerning fund of knowledge and  orientation, definitively she thinks that the year is 47, the month is  November.  She also believes she has 5 children but then states 2 boys and 2  girls.  While her son is in the room she identifies him as her ex-husband.  Thought content:  There are no thoughts of harming herself, no thoughts of  harming others.  No hallucinations.  She is paranoid.  She states that her  Posey belt is a belt for her pants.  On affect, she is guarded and  suspicious.  Her insight is poor.  Her  judgment is impaired.  Memory is poor  for immediate and recent and partially impaired for remote.  Her fund of  knowledge and intelligence are decreased below that of her estimated  premorbid level.  Concentration decreased.   ASSESSMENT:  AXIS I:  1.  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  293.81.  2.  Dementia not otherwise specified.  AXIS II:  None.  AXIS III:  See the general medical problems above.  AXIS IV:  General medical.  AXIS V:  30.   The patient has critical memory deficits and impairment in judgment.  She  does not have the capacity for informed consent.   RECOMMENDATIONS:  Her paranoia has removed; however, if it does not resolve,  would start Seroquel 25 mg p.o. q.h.s. and would titrate as tolerated by 25  mg every day to approximately 100 mg q.h.s. with a trial off within 2  months; would take the usual cautions about involuntary movements and  hyperglycemia, as well as excess sedation.   Will continue the Haldol p.r.n. severe agitation, combativeness as written  at this time.   The patient's decapacitating condition is not due to the psychosis but is  due to the dementia, and it is anticipated that this capacity will not  return.  It is, therefore, recommended that this patient be placed in a  skilled nursing facility or other similar environment.      Antonietta Breach, M.D.  Electronically Signed     JW/MEDQ  D:  10/31/2005  T:  10/31/2005  Job:  [id number]

## 2010-09-27 NOTE — H&P (Signed)
[name], GRIEDER NO.:  [id number]   MEDICAL RECORD NO.:  [id number]          PATIENT TYPE:  INP   LOCATION:  3014                         FACILITY:  [facility]   PHYSICIAN:  Hettie Holstein, D.O.    DATE OF BIRTH:  Apr 20, 1930   DATE OF ADMISSION:  10/26/2005  DATE OF DISCHARGE:                                HISTORY & PHYSICAL   PRIMARY CARE PHYSICIAN:  Dr. __________  with [facility].   CHIEF COMPLAINT:  Dizziness.   HISTORY OF PRESENTING ILLNESS:  Mrs. Sara [name] is a pleasant, African-  American female, 77 years old, who suffers from dementia and has been in  denial for quite some time.  She lives at home with her daughter, Sara [name].  They have been struggling with a confusion and a delusional type of behavior  at home for several weeks.  Mrs. Sara [name] currently refused to return to her  home because she felt that she could hear voices and people there, which the  family has verified was not the case.  She had additional delusions that her  ex-husband came to her home and stole her license plates off of her  automobile.  Her daughter, due to fears of her driving, have actually  removed her license plates.  They were initially concerned when she pulled  out of her driveway one day and forgot how to put her car into drive and sat  with her car running in the middle of the street.  In any event, there have  been multiple episodes of problems such as this.  This a.m., however, Mrs.  [name] was unable to walk  her daughter said that when they were  transferring her and subsequently they brought her to the emergency  department.   Sara [name] does have a prior history of noncompliance into her most recent  hospitalization in July of 2006 where she suffered a non-ST segment  elevation MI in addition to cerebrovascular accident.  She had some  transient right arm weakness at the time, and these have resolved at  present; however, she has continued to have some  elements of anxiety and  confusion and hypersensitivity to the diagnosis of Alzheimer's dementia.  In  fact, her prior primary care physician she had refused to return to because  of just the simple mention of the possibility of Alzheimer's dementia.  In  the emergency department, she was noted to have 7 to 10 WBCs and is being  admitted for further evaluation of altered mental status and ataxia.  She  does have some dysmetria in the emergency department, and prior imaging  studies do reveal evidence of cerebrovascular disease and additionally she  does have evidence of carotid artery disease.  She was evaluated by Dr.  Edilia Bo of [facility] and, due to her other underlying issues at that time, she  was not deemed a candidate for endarterectomy.  I am not certain of the  status at this time and followup with Dr. Edilia Bo of [facility].  I suspect due to  her current dementia and compliance issues, she  is likely not the best  candidate for this procedure.   PAST MEDICAL HISTORY:  1.  As noted above, a non-ST segment elevation MI in July 2007.  She did      have a __________ ejection fraction of about 55 to 65%.  She was seen in      consultation by Dr. Gala Romney of [facility], and she did have a      two-D echocardiogram revealing some aortic stenosis and mild elevation      of pulmonary artery pressure.  2.  She suffers from Alzheimer's dementia with paranoia and delusions.  In      fact, at this time she is unable to recite the date, the year, or a      month.  She is oriented to place and person.  3.  Cerebrovascular accident without evident residual.  4.  Diabetes mellitus.  5.  Moderate aortic stenosis.  6.  Anxiety.  7.  Carotid artery disease.   MEDICATIONS:  The family states that Sara [name] does not take any of her  medications.  She will occasionally take her blood pressure medications,  which the family cannot recall; however, her medical records and discharge  summary dictated  by Dr. Derenda Mis reveal that she had been on Lopressor  and Imdur as well as Lasix.  I do see that she was on Foltx as well.  She  had been under consideration for the start of medications for her dementia,  which she was quite hesitant to initiate.   ALLERGIES:  No known drug allergies.   SOCIAL HISTORY:  The patient is separated for many years now.  She has four  living children, two sons, two daughters.  She lives with one of her  daughters currently as she recently has refused to return to her home due to  the forces as described above.  Her daughter, Sara [name], is reached at [phone].  There was no healthcare power-of-attorney in place at this time.  She  formerly worked at [employer] as well as [employer] in the past in the various  positions.   FAMILY HISTORY:  Noncontributory.   REVIEW OF SYSTEMS:  Family states that they have a noticed a functional  decline and some falling at home.  Her oral intake has been okay.  She has  had no diarrhea or nausea or vomiting, no chest pain or shortness of breath.  They had an appointment this past Wednesday with [facility].   PHYSICAL EXAMINATION:  VITAL SIGNS:  In the emergency department, her blood  pressure was 169/67, temperature 100.3, heart rate 79, respirations 20, O2  saturation 99% on room air.  GENERAL:  The patient is pleasant, alert, answering questions though some  component of confabulation that is consistent.  She is oriented to place,  however, disoriented to the time.  She thinks it is 59 in October and  seems to guess at the date of the 7th.  HEENT:  Her head is normocephalic.  There is no facial droop.  Extraocular  muscles are intact.  NECK:  Supple, nontender.  There is no palpable thyromegaly.  CARDIOVASCULAR:  Normal S1 and S2.  There is a right sternal border mid  systolic crescendo-decrescendo murmur of 3/6.  LUNGS:  Clear bilaterally.  ABDOMEN:  Soft and nontender. EXTREMITIES:  No edema.  No focal  weakness.  NEUROLOGIC:  Reveals some dysmetria.   LABORATORY DATA:  Sodium 135, potassium 3.6, BUN 10, creatinine 0.7,  glucose  159, AST/ALT 22/12, albumin 3.1, WBC 7, hemoglobin 12, platelet count of  221, MCV of 96.  Urinalysis revealed 7 to 10 WBCs.  CT revealed no acute  bleed.  Some old infarcts were noted.   ASSESSMENT:  1.  Ataxia.  2.  Dementia with psychotic features.  3.  Medical noncompliance.  4.  Urinary tract infection.  5.  Low albumin.  6.  Hypertension.   PLAN AT THIS TIME:  We will admit Mrs. Colla to the Neuro-Telemetry floor  for ataxia in addition to progressive functional decline for physical  therapy and occupational therapy evaluation.  We will check an MRI as she  does have increased risk factors for a stroke with a recent stroke and  medical noncompliance.  In addition, we  will do a workup for reversible causes of dementia, initiate a trial of  Aricept, and check an MRI.  She will need a psychiatric evaluation and  certainly the family will need the assistance with movement towards  establishing a healthcare power-of-attorney, which may require a competency  evaluation.      Hettie Holstein, D.O.     ESS/MEDQ  D:  10/26/2005  T:  10/26/2005  Job:  [id number]   cc:   Dr. Kittie Plater ?????

## 2010-09-27 NOTE — Consult Note (Signed)
Sara [name], Sara [name]                ACCOUNT NO.:  [id number]   MEDICAL RECORD NO.:  [id number]          PATIENT TYPE:  INP   LOCATION:  3102                         FACILITY:  [facility]   PHYSICIAN:  Gustavus Messing. Orlin Hilding, M.D.DATE OF BIRTH:  10-24-1929   DATE OF CONSULTATION:  12/02/2004  DATE OF DISCHARGE:                                   CONSULTATION   CHIEF COMPLAINT:  Right-sided weakness.   PRIMARY CARE PHYSICIAN:  Dr. Woodfin Ganja   HISTORY OF PRESENT ILLNESS:  Sara [name] is a 77 year old right-handed black  woman with a history of dementia and an old stroke, although it may have  been unrecognized.  Family was unable to reach the patient who lives alone  for about three days.  They eventually called EMS and the family and EMS  arrived more or less together.  Patient was found on the floor in her  bedroom in a puddle of urine.  She was conscious, but could not get up.  She  had right arm weakness and there was right facial droop.  The right facial  droop but the total right arm weakness persists.  She was also confused.  CT  scan was negative for any acute abnormality.  Did show an old right MCA  branch infarction which was incidentally demonstrated on a previous study in  March of this year.  However, an MRI was performed and was questionably  positive for an acute tiny brain stem infarct on the right by DWI.  There is  also suspicion on the MRA of right proximal ICA stenosis and the old right  MCA infarct is again demonstrated.   REVIEW OF SYSTEMS:  Patient denies any significant pain in the right upper  extremity currently, but has had right arm problems including carpal tunnel  and right rotator cuff problems but she has otherwise been able to use the  arm perfectly well.  She is confused.  No current chest pain or shortness of  breath.   PAST MEDICAL HISTORY:  1.  Dementia.  Dr. Dorothe Pea tried to begin Aricept three months ago but the      patient refused.  This was around  the time that she had an episode of      abrupt change with increased confusion.  CT showed an old right MCA      infarct, but nothing acute at that time.  2.  Anxiety.  3.  Hyperlipidemia.  4.  Type 2 diabetes.  5.  Hypertension.  6.  Old stroke which is unknown clinically.   CURRENT MEDICATIONS:  Lovenox, NovoLog, Lopressor, Cipro, aspirin 325 mg,  Valium, Xanax, Lipitor, Tylenol, Phenergan, Dulcolax.   ALLERGIES:  No known drug allergies.   SOCIAL HISTORY:  She is divorced.  Lives alone.  Has four children.  She is  a remote smoker.  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   FAMILY HISTORY:  Positive for coronary artery disease and cerebrovascular  disease.   PHYSICAL EXAMINATION:  VITAL SIGNS:  Temperature 98.2, pulse 85, blood  pressure 115/36,  respirations 14 with 95% on room air.  HEENT:  Head is normocephalic.  She has some bruising on the left chin.  NECK:  Supple without bruits.  NEUROLOGIC:  Mental status:  She is awake and alert.  There is normal  naming, repetition.  She does well with comprehension with simple commands,  but has trouble with complex commands.  She is not oriented to her age, the  year, month, day, or date.  She is oriented to [facility].  She knows her  primary physician's name.  She is oriented to city and state, but not  county.  She registers three out of three, but can only recall one of them  after distraction.  She spells world forward correctly, but backwards  splits some letters dlorw.  Cranial nerve examination:  Pupils are equal  and reactive.  Visual fields are full to confrontation.  Extraocular  movements are intact.  Facial sensation is normal.  Facial motor activity is  normal without any droop at this time.  Hearing is intact.  Palate is  symmetric and tongue is midline.  Motor examination:  She has 5/5 strength  without drift in the left upper extremity and bilateral lower extremities.  She has 1/5 right  upper extremity diffusely.  She can wiggle her thumb a  little, slight movement at the wrist, slight shoulder movement, not much  biceps, a little bit of triceps.  It is fairly diffuse and it is consistent  with a central pattern such as a stroke.  There is no fasciculations,  atrophy, or tremors.  Deep tendon reflexes are 1+ in the left.  Biceps  otherwise negative diffusely.  Downgoing toes bilaterally.  Coordination:  Left finger to nose and bilateral heel to shin are normal.  She can not do  right finger to nose.  Sensory examination is intact.   MRI shows no definite acute abnormalities.  There is a question of a tiny  little pontomedullary lesion on the right.  I do not really think that is  real.  She has an old right MCA branch infarct, old left pons lacune,  bilateral cerebral lacunar infarctions, and small vessel disease.  There is  some problems with the A1 segment on the right, small caliber right ICA, and  carotid Doppler shows blunted distal right CCA and 60-80% left ICA with  suspicion of EC/IC collateral flow on the right.  In addition, the patient  is also felt to have sustained a non Q-wave MI.   IMPRESSION:  1.  Right arm weakness which is global with right facial weakness which has      cleared clinically consistent with a stroke, although the MRI is      unimpressive.  2.  Suspicion of right internal carotid artery/CCA stenosis which would not      be relevant to her current symptoms and question of significant left      internal carotid artery stenosis 60-80% upper end.  3.  Dementia.   RECOMMENDATIONS:  MRI of the neck to better define the carotids bilaterally.  She may need cerebral angiogram if an aggressive approach such as surgery or  stenting is to be considered.  Aspirin for now until this is resolved and  Aricept for the dementia.  Stroke service will follow.      CAW/MEDQ  D:  12/02/2004  T:  12/02/2004  Job:  [id number]

## 2010-10-31 ENCOUNTER — Emergency Department (HOSPITAL_COMMUNITY): Payer: Medicare Other

## 2010-10-31 ENCOUNTER — Inpatient Hospital Stay (HOSPITAL_COMMUNITY)
Admission: EM | Admit: 2010-10-31 | Discharge: 2010-11-08 | DRG: 504 | Disposition: A | Discharge status: Skilled Nursing Facility | Payer: Medicare Other | Attending: Internal Medicine | Admitting: Internal Medicine

## 2010-10-31 DIAGNOSIS — F0390 Unspecified dementia without behavioral disturbance: Secondary | ICD-10-CM | POA: Diagnosis present

## 2010-10-31 DIAGNOSIS — I2510 Atherosclerotic heart disease of native coronary artery without angina pectoris: Secondary | ICD-10-CM | POA: Diagnosis present

## 2010-10-31 DIAGNOSIS — M869 Osteomyelitis, unspecified: Principal | ICD-10-CM | POA: Diagnosis present

## 2010-10-31 DIAGNOSIS — Z8673 Personal history of transient ischemic attack (TIA), and cerebral infarction without residual deficits: Secondary | ICD-10-CM

## 2010-10-31 DIAGNOSIS — E119 Type 2 diabetes mellitus without complications: Secondary | ICD-10-CM | POA: Diagnosis present

## 2010-10-31 DIAGNOSIS — R197 Diarrhea, unspecified: Secondary | ICD-10-CM | POA: Diagnosis present

## 2010-10-31 DIAGNOSIS — L02619 Cutaneous abscess of unspecified foot: Secondary | ICD-10-CM | POA: Diagnosis present

## 2010-10-31 DIAGNOSIS — D638 Anemia in other chronic diseases classified elsewhere: Secondary | ICD-10-CM | POA: Diagnosis present

## 2010-10-31 DIAGNOSIS — E785 Hyperlipidemia, unspecified: Secondary | ICD-10-CM | POA: Diagnosis present

## 2010-10-31 DIAGNOSIS — I10 Essential (primary) hypertension: Secondary | ICD-10-CM | POA: Diagnosis present

## 2010-10-31 LAB — BASIC METABOLIC PANEL
CO2: 26 mEq/L (ref 19–32)
Calcium: 10.3 mg/dL (ref 8.4–10.5)
GFR calc non Af Amer: 56 mL/min — ABNORMAL LOW (ref >60)
Glucose, Bld: 96 mg/dL (ref 70–99)
Potassium: 4.5 mEq/L (ref 3.5–5.1)
Sodium: 134 mEq/L — ABNORMAL LOW (ref 135–145)

## 2010-10-31 LAB — CBC
Hemoglobin: 9 g/dL — ABNORMAL LOW (ref 12.0–15.0)
MCH: 31.5 pg (ref 26.0–34.0)
Platelets: 267 10*3/uL (ref 150–400)
RBC: 2.86 MIL/uL — ABNORMAL LOW (ref 3.87–5.11)

## 2010-10-31 LAB — MRSA PCR SCREENING: MRSA by PCR: NEGATIVE

## 2010-10-31 LAB — DIFFERENTIAL
Basophils Absolute: 0 10*3/uL (ref 0.0–0.1)
Basophils Relative: 1 % (ref 0–1)
Eosinophils Absolute: 0.3 10*3/uL (ref 0.0–0.7)
Neutro Abs: 4.8 10*3/uL (ref 1.7–7.7)
Neutrophils Relative %: 61 % (ref 43–77)

## 2010-10-31 LAB — GLUCOSE, CAPILLARY: Glucose-Capillary: 76 mg/dL (ref 70–99)

## 2010-11-01 ENCOUNTER — Inpatient Hospital Stay (HOSPITAL_COMMUNITY): Payer: Medicare Other

## 2010-11-01 LAB — HEMOGLOBIN A1C: Mean Plasma Glucose: 131 mg/dL — ABNORMAL HIGH (ref <117)

## 2010-11-01 LAB — MAGNESIUM: Magnesium: 1.9 mg/dL (ref 1.5–2.5)

## 2010-11-01 NOTE — H&P (Signed)
Sara [name], MUMPOWER NO.:  [id number]  MEDICAL RECORD NO.:  [id number]  LOCATION:  WLED                         FACILITY:  [facility]  PHYSICIAN:  Talmage Nap, MD  DATE OF BIRTH:  03-May-1930  DATE OF ADMISSION:  10/31/2010 DATE OF DISCHARGE:                             HISTORY & PHYSICAL   PRIMARY CARE PHYSICIAN:  Maxwell Caul, M.D.  History obtainable from the ED physician, Dr. Dierdre Highman and also from the ED notes.  No family member available to give any additional history.  CHIEF COMPLAINT:  Pain, left foot.  HISTORY OF PRESENT ILLNESS:  Patient is a 77 year old African-American female, who is a resident of the nursing home, who was actually discharged from the hospital on August 20, 2010 for urinary tract infection.  She was, however, brought back today because of pain in the left foot.  There was, however, no history of fever.  There was no history of chills.  There was no history of rigor.  Patient is known to have diabetic foot ulcer.  There was no history of nausea or vomiting. Although, patient complained about worse pain in the left foot and no family member to give any additional history.  PAST MEDICAL HISTORY:  Positive for: 1. Dementia. 2. Coronary artery disease. 3. CVA (multiple lacunar strokes). 4. Hypertension. 5. Diabetes mellitus. 6. Hyperlipidemia. 7. History of urinary tract infection.  PAST SURGICAL HISTORY:  Left femoral neck fracture, status post hemiarthroplasty.  PREADMISSION MEDICATIONS:  Include: 1. Med Pass supplement drink 4 ounces three times a day. 2. Plavix 75 mg p.o. daily. 3. Lisinopril 20 mg one p.o. daily. 4. Vicodin (hydrocodone/APAP) 5/500 one p.o. q.4h. p.r.n. 5. Central-Vite (multivitamin) one p.o. daily. 6. Beneprotein powder 6 gram per dose two scoops b.i.d. 7. Aricept (donepezil) 10 mg p.o. daily.  ALLERGIES:  No known allergy.  SOCIAL HISTORY:  Patient is said to live in a nursing home.  No  history of alcohol or tobacco use and she is demented.  FAMILY HISTORY:  Unknown.  REVIEW OF SYSTEMS:  Patient presently denies any headaches.  No blurry vision.  No nausea or vomiting.  No fever.  No chills.  No rigor.  No cough.  No chest pain or shortness of breath.  Denies any abdominal discomfort.  No diarrhea or hematochezia.  No dysuria or hematuria. Complained about pain in the left foot.  No intolerance to heat or cold and no neuropsychiatric disorder.  PHYSICAL EXAMINATION:  GENERAL:  Very pleasant lady, demented, but not in any respiratory distress, well-hydrated. PRESENT VITAL SIGNS:  Blood pressure is 154/82, pulse is 55, respiratory rate 18, temperature is 99.2. HEENT:  Mild pallor, but pupils are reactive to light and extraocular muscles are intact. NECK:  No jugular venous distention.  No carotid bruit.  No lymphadenopathy. CHEST:  Clear to auscultation. HEART:  Sounds are one and two. ABDOMEN:  Soft, nontender.  Liver, spleen and kidney not palpable. Bowel sounds are positive. EXTREMITIES:  Show left diabetic foot ulcer.  No pedal edema. NEUROLOGIC EXAMINATION:  Nonfocal. MUSCULOSKELETAL SYSTEM:  Shows arthritic changes in the knees and in the feet. NEUROPSYCHIATRIC EVALUATION:  Showed patient to be presently confused.  LABORATORY DATA:  Initial complete blood count with differential showed WBC of 7.9, hemoglobin of 9.0, hematocrit of 27.5, MCV of 96.2 with the platelet count of 267,000 with normal differentials and basic metabolic panel showed sodium of 134, potassium of 4.5, chloride of 99 with the bicarb of 26, glucose is 96, BUN is 35 and creatinine 0.97.  DIAGNOSTIC STUDIES:  Imaging studies done on the patient include x-ray of left foot, which showed erosive/postoperative changes in the distal phalanx of the great toe and diffuse osteopenia without any acute abnormality.  IMPRESSION: 1. Diabetic foot ulcer, rule out osteomyelitis. 2. Dementia. 3.  History of cerebrovascular accident. 4. Hypertension. 5. Hyperlipidemia. 6. Coronary artery disease. 7. Type 2 diabetes mellitus. 8. Anemia.  PLAN:  Plan is to admit patient to general medical floor.  Patient will be saline locked.  She will be antibiotics, Zosyn 2.275 grams IV q.8h. and vancomycin 1 gram IV stat and dosing to be done by pharmacy.  Pain control will be with Dilaudid 1 mg IV q.4h. p.r.n.  Other medications to be given to the patient will include Plavix 75 mg p.o. daily, lisinopril 20 mg p.o. daily and Central-Vite one p.o. daily, donepezil 10 mg p.o. daily.  Patient will be on Accu-Cheks t.i.d. with a.c. h.s. and regular insulin sliding scale (on scale).  GI prophylaxis will be with Protonix 40 mg p.o. daily and DVT prophylaxis will be with Lovenox 40 mg subcu q.24h.  Further workup to be done on this patient will include blood culture x2, wound culture.  Imaging study to be ordered will include MRI of the left foot to rule out osteomyelitis. Wound care nurse will be consulted in a.m. for further evaluation of this patient.  CBC, CMP and magnesium will be repeated in a.m.  Patient will be followed and evaluated on day-to-day basis.     Talmage Nap, MD     CN/MEDQ  D:  10/31/2010  T:  10/31/2010  Job:  [id number]  Electronically Signed by Talmage Nap  on 11/01/2010 06:54:11 AM

## 2010-11-02 LAB — SEDIMENTATION RATE: Sed Rate: 97 mm/hr — ABNORMAL HIGH (ref 0–22)

## 2010-11-02 LAB — CBC
HCT: 26.7 % — ABNORMAL LOW (ref 36.0–46.0)
MCHC: 32.6 g/dL (ref 30.0–36.0)
MCV: 95.7 fL (ref 78.0–100.0)
Platelets: 245 10*3/uL (ref 150–400)
RDW: 12.7 % (ref 11.5–15.5)
WBC: 9.7 10*3/uL (ref 4.0–10.5)

## 2010-11-02 LAB — GLUCOSE, CAPILLARY
Glucose-Capillary: 213 mg/dL — ABNORMAL HIGH (ref 70–99)
Glucose-Capillary: 108 mg/dL — ABNORMAL HIGH (ref 70–99)

## 2010-11-02 LAB — BASIC METABOLIC PANEL
BUN: 25 mg/dL — ABNORMAL HIGH (ref 6–23)
Creatinine, Ser: 1.05 mg/dL (ref 0.50–1.10)
GFR calc Af Amer: >60 mL/min (ref >60)
GFR calc non Af Amer: 51 mL/min — ABNORMAL LOW (ref >60)

## 2010-11-02 LAB — C-REACTIVE PROTEIN: CRP: 2.9 mg/dL — ABNORMAL HIGH (ref <0.6)

## 2010-11-03 ENCOUNTER — Inpatient Hospital Stay (HOSPITAL_COMMUNITY): Payer: Medicare Other

## 2010-11-04 ENCOUNTER — Ambulatory Visit (HOSPITAL_COMMUNITY): Payer: Medicare Other

## 2010-11-04 LAB — BASIC METABOLIC PANEL
BUN: 21 mg/dL (ref 6–23)
CO2: 25 mEq/L (ref 19–32)
Calcium: 9.5 mg/dL (ref 8.4–10.5)
GFR calc non Af Amer: 52 mL/min — ABNORMAL LOW (ref >60)
Glucose, Bld: 108 mg/dL — ABNORMAL HIGH (ref 70–99)
Sodium: 136 mEq/L (ref 135–145)

## 2010-11-04 LAB — CBC
HCT: 27.8 % — ABNORMAL LOW (ref 36.0–46.0)
Hemoglobin: 9.1 g/dL — ABNORMAL LOW (ref 12.0–15.0)
MCH: 31.8 pg (ref 26.0–34.0)
MCHC: 32.7 g/dL (ref 30.0–36.0)
RBC: 2.86 MIL/uL — ABNORMAL LOW (ref 3.87–5.11)

## 2010-11-04 LAB — GLUCOSE, CAPILLARY: Glucose-Capillary: 109 mg/dL — ABNORMAL HIGH (ref 70–99)

## 2010-11-04 LAB — VANCOMYCIN, TROUGH: Vancomycin Tr: 18.8 ug/mL (ref 10.0–20.0)

## 2010-11-04 MED ORDER — GADOBENATE DIMEGLUMINE 529 MG/ML IV SOLN
10.0000 mL | Freq: Once | INTRAVENOUS | Status: AC
  Administered 2010-11-04: 10 mL via INTRAVENOUS

## 2010-11-05 LAB — BASIC METABOLIC PANEL
CO2: 23 mEq/L (ref 19–32)
Calcium: 9.6 mg/dL (ref 8.4–10.5)
Creatinine, Ser: 0.84 mg/dL (ref 0.50–1.10)
GFR calc Af Amer: >60 mL/min (ref >60)
Sodium: 136 mEq/L (ref 135–145)

## 2010-11-05 LAB — GLUCOSE, CAPILLARY
Glucose-Capillary: 135 mg/dL — ABNORMAL HIGH (ref 70–99)
Glucose-Capillary: 151 mg/dL — ABNORMAL HIGH (ref 70–99)
Glucose-Capillary: 70 mg/dL (ref 70–99)
Glucose-Capillary: 103 mg/dL — ABNORMAL HIGH (ref 70–99)

## 2010-11-05 LAB — MAGNESIUM: Magnesium: 1.9 mg/dL (ref 1.5–2.5)

## 2010-11-05 LAB — PROTIME-INR: INR: 1.08 (ref 0.00–1.49)

## 2010-11-05 NOTE — Discharge Summary (Signed)
NAMECHARMAGNE, BUHL NO.:  [id number]  MEDICAL RECORD NO.:  [id number]  LOCATION:  1505                         FACILITY:  [facility]  PHYSICIAN:  Conley Canal, MD      DATE OF BIRTH:  [birth year]  DATE OF ADMISSION:  10/31/2010 DATE OF DISCHARGE:                        DISCHARGE SUMMARY - REFERRING   PRIMARY CARE PHYSICIAN:  Maxwell Caul, M.D.  PROBLEM LIST: 1. Osteomyelitis of the first metatarsal and proximal first phalanx of     the left foot. 2. Large soft tissue abscess wrapping around the medial plantar aspect     of the first metatarsal. 3. Diarrhea, query etiology. 4. Advanced dementia. 5. Coronary artery disease, stroke, history of multiple lacunar     cerebrovascular accidents. 6. Malignant hypertension. 7. Diabetes mellitus type 2. 8. Hyperlipidemia.  PROCEDURES PERFORMED:  MRI of the left foot with and without contrast on November 05, 2010, showed findings consistent with osteomyelitis of the first metatarsal and proximal first phalanx and a large soft tissue abscess wrapping around the medial plantar aspect of the first metatarsal.  HOSPITAL COURSE:  This frail elderly lady with advanced dementia who is a nursing home resident came in with complaints of left foot pain with concerns for osteomyelitis.  Because of the dementia and agitation, she had to get MRI of the foot and conscious sedation at [facility] which was done on November 04, 2010, with findings concerning for osteomyelitis as well as abscess.  Since admission, the patient has been on Zosyn and vancomycin. Developments in the hospital include diarrhea since November 04, 2010. Stool for C difficile pending.  Otherwise the patient generally disoriented and unable to give meaningful history.  She is occasionally agitated and contrary but can sometimes be sweet.  This morning, she denies any complaints.  PHYSICAL EXAMINATION:  GENERAL:  On exam, she is not in distress. VITAL SIGNS:  Blood  pressure 127/74, heart rate is 61, temperature 97.7, respirations 20, oxygen saturation is 98% on room air. HEAD, EARS, NOSE AND THROAT EXAMINATION:  Pupils equal, reacting to light.  No jugular venous distention, no carotid bruits. RESPIRATORY SYSTEM:  Good air entry bilaterally with no rhonchi, rales or wheezes. CARDIOVASCULAR SYSTEM:  First and second heart sounds heard.  No murmurs.  Pulse regular. ABDOMEN:  Benign. CNS:  No acute focal neurological deficits. EXTREMITIES:  No drainage from left foot.  PLAN: 1. Left foot osteomyelitis of the first metatarsal and proximal first     phalanx as well as soft tissue abscess wrapping around the medial     plantar aspect of the first metatarsal.  We will ask Orthopedics to     see, Dr. Jerl Santos with [facility] who will graciously     see, I left a message with his office.  Meanwhile, continue Zosyn     and vancomycin.  We will keep the patient n.p.o., hold Lovenox,     Plavix anticipating surgery.  I called the patient's family with     contact numbers in the system and left a message for [place] to update     them on the developments and that the patient will probably need  surgery. 2. Diarrhea, query cause, would worry for clostridium difficile     colitis as the patient has been on broad-spectrum antibiotics, it     may be antibiotic induced diarrhea.  Await stool for C diff; if     negative, can give antispasmodics. 3. Advanced dementia.  The patient nursing home resident.  Continue     supportive care.  The patient does not have capacity to make     independent decisions regarding care. 4. Hypertension.  Blood pressure reasonably controlled. 5. Diabetes mellitus, well controlled. 6. Disposition:  Eventually skilled nursing facility.     Conley Canal, MD     SR/MEDQ  D:  11/05/2010  T:  11/05/2010  Job:  [id number]  Electronically Signed by Conley Canal  on 11/05/2010 10:22:56 AM

## 2010-11-06 LAB — GLUCOSE, CAPILLARY
Glucose-Capillary: 78 mg/dL (ref 70–99)
Glucose-Capillary: 86 mg/dL (ref 70–99)

## 2010-11-07 LAB — CBC
MCH: 31.8 pg (ref 26.0–34.0)
MCHC: 32.6 g/dL (ref 30.0–36.0)
MCV: 97.5 fL (ref 78.0–100.0)
Platelets: 228 10*3/uL (ref 150–400)
RDW: 13.4 % (ref 11.5–15.5)
WBC: 10.3 10*3/uL (ref 4.0–10.5)

## 2010-11-07 LAB — CULTURE, BLOOD (ROUTINE X 2)
Culture  Setup Time: 201206221007
Culture: NO GROWTH

## 2010-11-07 LAB — GLUCOSE, CAPILLARY
Glucose-Capillary: 108 mg/dL — ABNORMAL HIGH (ref 70–99)
Glucose-Capillary: 125 mg/dL — ABNORMAL HIGH (ref 70–99)

## 2010-11-07 LAB — ABO/RH: ABO/RH(D): A POS

## 2010-11-07 LAB — BASIC METABOLIC PANEL
BUN: 18 mg/dL (ref 6–23)
Calcium: 9.3 mg/dL (ref 8.4–10.5)
Creatinine, Ser: 1.19 mg/dL — ABNORMAL HIGH (ref 0.50–1.10)
GFR calc Af Amer: 53 mL/min — ABNORMAL LOW (ref >60)
GFR calc non Af Amer: 44 mL/min — ABNORMAL LOW (ref >60)

## 2010-11-08 LAB — BASIC METABOLIC PANEL
BUN: 16 mg/dL (ref 6–23)
Chloride: 102 mEq/L (ref 96–112)
Creatinine, Ser: 0.86 mg/dL (ref 0.50–1.10)
GFR calc Af Amer: >60 mL/min (ref >60)
Glucose, Bld: 98 mg/dL (ref 70–99)
Potassium: 4 mEq/L (ref 3.5–5.1)

## 2010-11-08 LAB — CBC
HCT: 24.4 % — ABNORMAL LOW (ref 36.0–46.0)
Hemoglobin: 8.3 g/dL — ABNORMAL LOW (ref 12.0–15.0)
MCV: 92.4 fL (ref 78.0–100.0)
RDW: 14.3 % (ref 11.5–15.5)
WBC: 9.4 10*3/uL (ref 4.0–10.5)

## 2010-11-08 NOTE — Discharge Summary (Signed)
NAMESTEFANI, BAIK NO.:  [id number]  MEDICAL RECORD NO.:  [id number]  LOCATION:  1505                         FACILITY:  [facility]  PHYSICIAN:  Andreas Blower, MD       DATE OF BIRTH:  May 04, 1930  DATE OF ADMISSION:  10/31/2010 DATE OF DISCHARGE:                        DISCHARGE SUMMARY - REFERRING   ADDENDUM:  PRIMARY CARE PHYSICIAN:  Dr. Baltazar Najjar.  ORTHOPEDIC PHYSICIAN:  Nadara Mustard, MD  This is an addendum discharge summary dictation.  Please refer to discharge summary dictated by Dr. Venetia Constable on November 05, 2010, for more details.  DISCHARGE DIAGNOSES: 1. Osteomyelitis of the first metatarsal and proximal first phalanx of     the left foot status post left foot first ray amputation on November 06, 2010. 2. Large soft tissue abscess wrapping around the medial plantar aspect     of the first metatarsal, status post surgery on 11/06/2010. 3. Diarrhea, resolved. 4. Anemia due to chronic disease and delusional, transfused 1 unit of     packed red blood cells. 5. Advanced dementia. 6. History of coronary artery disease. 7. History of cerebrovascular accident. 8. Hypertension. 9. Type 2 diabetes, diet controlled. 10.Hyperlipidemia.  DISCHARGE MEDICATIONS: 1. Amlodipine 5 mg p.o. daily 2. Doxycycline 100 mg p.o. twice daily for at least 1 month. 3. Ensure 237 mL p.o. three times a day with meals. 4. Nitroglycerin patch 0.2 and 0.3 mg transdermal daily to be applied     to left dorsal ankle. 5. Tramadol 50 mg every 8 hours as needed for pain. 6. Hydrocodone/APAP 5/325 one tablet every 4 hours as needed for pain. 7. Donepezil 10 mg p.o. daily at bedtime. 8. Beneprotein 2 scoops twice daily. 9. Centrovite 1 tablet p.o. daily 10.Plavix 75 mg p.o. daily 11.Lisinopril 20 mg p.o. daily. 12.Med Pass 4 ounces p.o. 3 times a day.  HOSPITAL COURSE:  Please refer to discharge summary dictated on November 05, 2010 for more details.  Subsequently since November 05, 2010, the patient underwent left foot first ray amputation on November 06, 2010, for abscess osteomyelitis of the left foot.  Initially the patient was on vancomycin and Zosyn.  This was discontinued after talking to Dr. Lajoyce Corners, who thought that the patient could be adequately treated with oral doxycycline for at least 1 month.  Given her history of peripheral vascular disease, the patient was also placed on nitroglycerine patches on left dorsal foot to help with healing and promote blood supply to the left foot.  The patient also during the course of hospital stay had diarrhea which resolved.  C diff PCR was sent and was negative.  Also during the course of hospital stay, the patient had anemia likely due to chronic disease. Anemia panel done on August 19, 2010.  The patient was transfused 1 unit of PRBC on November 07, 2010. For diabetes, the patient initially was placed on sliding scale, however, her blood sugars were well controlled during the course of hospital stay with hemoglobin A1c of 6.2.  The patient will be discharged only on diabetic diet. Further management of diabetes to be done as an outpatient.  DISPOSITION AND FOLLOWUP:  The patient to follow up with Dr. Lajoyce Corners with Orthopedics in 2 weeks.  The patient to follow with Dr. Leanord Hawking, primary care physician, in 1 to 2 weeks as needed.  DIET:  Diabetic diet.  Time spent on discharge, talking to the patient and coordinating care was 25 minutes.   Andreas Blower, MD   SR/MEDQ  D:  11/08/2010  T:  11/08/2010  Job:  [id number]  Electronically Signed by Wardell Heath Wilmary Levit  on 11/08/2010 05:24:45 PM

## 2010-11-11 LAB — CROSSMATCH
ABO/RH(D): A POS
Unit division: 0

## 2010-11-11 LAB — GLUCOSE, CAPILLARY: Glucose-Capillary: 99 mg/dL (ref 70–99)

## 2010-11-20 ENCOUNTER — Emergency Department (HOSPITAL_COMMUNITY)
Admission: EM | Admit: 2010-11-20 | Discharge: 2010-11-20 | Disposition: A | Discharge status: Home / Self Care | Payer: Medicare Other | Attending: Emergency Medicine | Admitting: Emergency Medicine

## 2010-11-20 ENCOUNTER — Emergency Department (HOSPITAL_COMMUNITY): Payer: Medicare Other

## 2010-11-20 DIAGNOSIS — I2510 Atherosclerotic heart disease of native coronary artery without angina pectoris: Secondary | ICD-10-CM | POA: Insufficient documentation

## 2010-11-20 DIAGNOSIS — E119 Type 2 diabetes mellitus without complications: Secondary | ICD-10-CM | POA: Insufficient documentation

## 2010-11-20 DIAGNOSIS — M545 Low back pain, unspecified: Secondary | ICD-10-CM | POA: Insufficient documentation

## 2010-11-20 DIAGNOSIS — Z7902 Long term (current) use of antithrombotics/antiplatelets: Secondary | ICD-10-CM | POA: Insufficient documentation

## 2010-11-20 DIAGNOSIS — Z79899 Other long term (current) drug therapy: Secondary | ICD-10-CM | POA: Insufficient documentation

## 2010-11-20 DIAGNOSIS — Z8673 Personal history of transient ischemic attack (TIA), and cerebral infarction without residual deficits: Secondary | ICD-10-CM | POA: Insufficient documentation

## 2010-11-20 DIAGNOSIS — F0280 Dementia in other diseases classified elsewhere without behavioral disturbance: Secondary | ICD-10-CM | POA: Insufficient documentation

## 2010-11-20 DIAGNOSIS — E785 Hyperlipidemia, unspecified: Secondary | ICD-10-CM | POA: Insufficient documentation

## 2010-11-20 DIAGNOSIS — I10 Essential (primary) hypertension: Secondary | ICD-10-CM | POA: Insufficient documentation

## 2010-11-20 NOTE — Op Note (Signed)
  NAMEPAOLA, Sara [name]:  [id number]  MEDICAL RECORD NO.:  [id number]  LOCATION:  1505                         FACILITY:  [facility]  PHYSICIAN:  Nadara Mustard, MD     DATE OF BIRTH:  10/23/1929  DATE OF PROCEDURE: DATE OF DISCHARGE:                              OPERATIVE REPORT   PREOPERATIVE DIAGNOSES:  Abscess, osteomyelitis, ulceration, Wagner grade 3, left foot, first metatarsal.  POSTOPERATIVE DIAGNOSES:  Abscess, osteomyelitis, ulceration, Wagner grade 3, left foot, first metatarsal.  PROCEDURE: 1. Left foot first ray amputation. 2. Application of Acell xenograft powder and Acell tissue graft.  SURGEON:  Nadara Mustard, MD  ANESTHESIA:  General.  ESTIMATED BLOOD LOSS:  Minimal.  ANTIBIOTICS:  Vancomycin and Zosyn preoperatively.  DRAINS:  None.  COMPLICATIONS:  None.  TOURNIQUET TIME:  None.  DISPOSITION:  To PACU in stable condition.  INDICATIONS FOR PROCEDURE:  The patient is a 77 year old woman with severe peripheral vascular disease with osteomyelitis, abscess, and ulceration, medial column, left foot.  She presents at this time for surgical intervention.  Risks and benefits were discussed with the patient and her family including persistent infection, neurovascular injury, nonhealing wound, need for higher-level amputation.  The patient with family state they understand and wished to proceed at this time.  DESCRIPTION OF PROCEDURE:  The patient was brought to OR room 2 and underwent general anesthetic.  After adequate level of anesthesia obtained, the patient's left lower extremity prepped using DuraPrep and draped into a sterile field.  A medial longitudinal incision was made to encompass the ulcer.  This tissue and the first metatarsal and great toe were resected as well as the distal half of the second metatarsal was also resected.  The wound was irrigated with normal saline.  There was a very minimal amount of petechial  bleeding.  The incision was closed using 2-0 nylon with far near and near far suture technique deep in the wound.  The vena graft Acell powder was placed deep in the wound and the surgical incision was then covered with the Acell tissue graft and this was sutured in place.  The wound was covered with Adaptic, 4 x 4s, ABD, Kerlix and Coban.  The patient was extubated, taken to PACU in stable condition     Nadara Mustard, MD     MVD/MEDQ  D:  11/06/2010  T:  11/06/2010  Job:  [id number]  Electronically Signed by Aldean Baker MD on 11/20/2010 09:40:05 AM

## 2010-12-08 IMAGING — CR DG CHEST 1V PORT
1 series · 1 of 1 positions shown · non-contrast
Comparison: 02/16/2007

CLINICAL DATA: Altered level of consciousness.

PORTABLE CHEST - 1 VIEW

[series [date]]
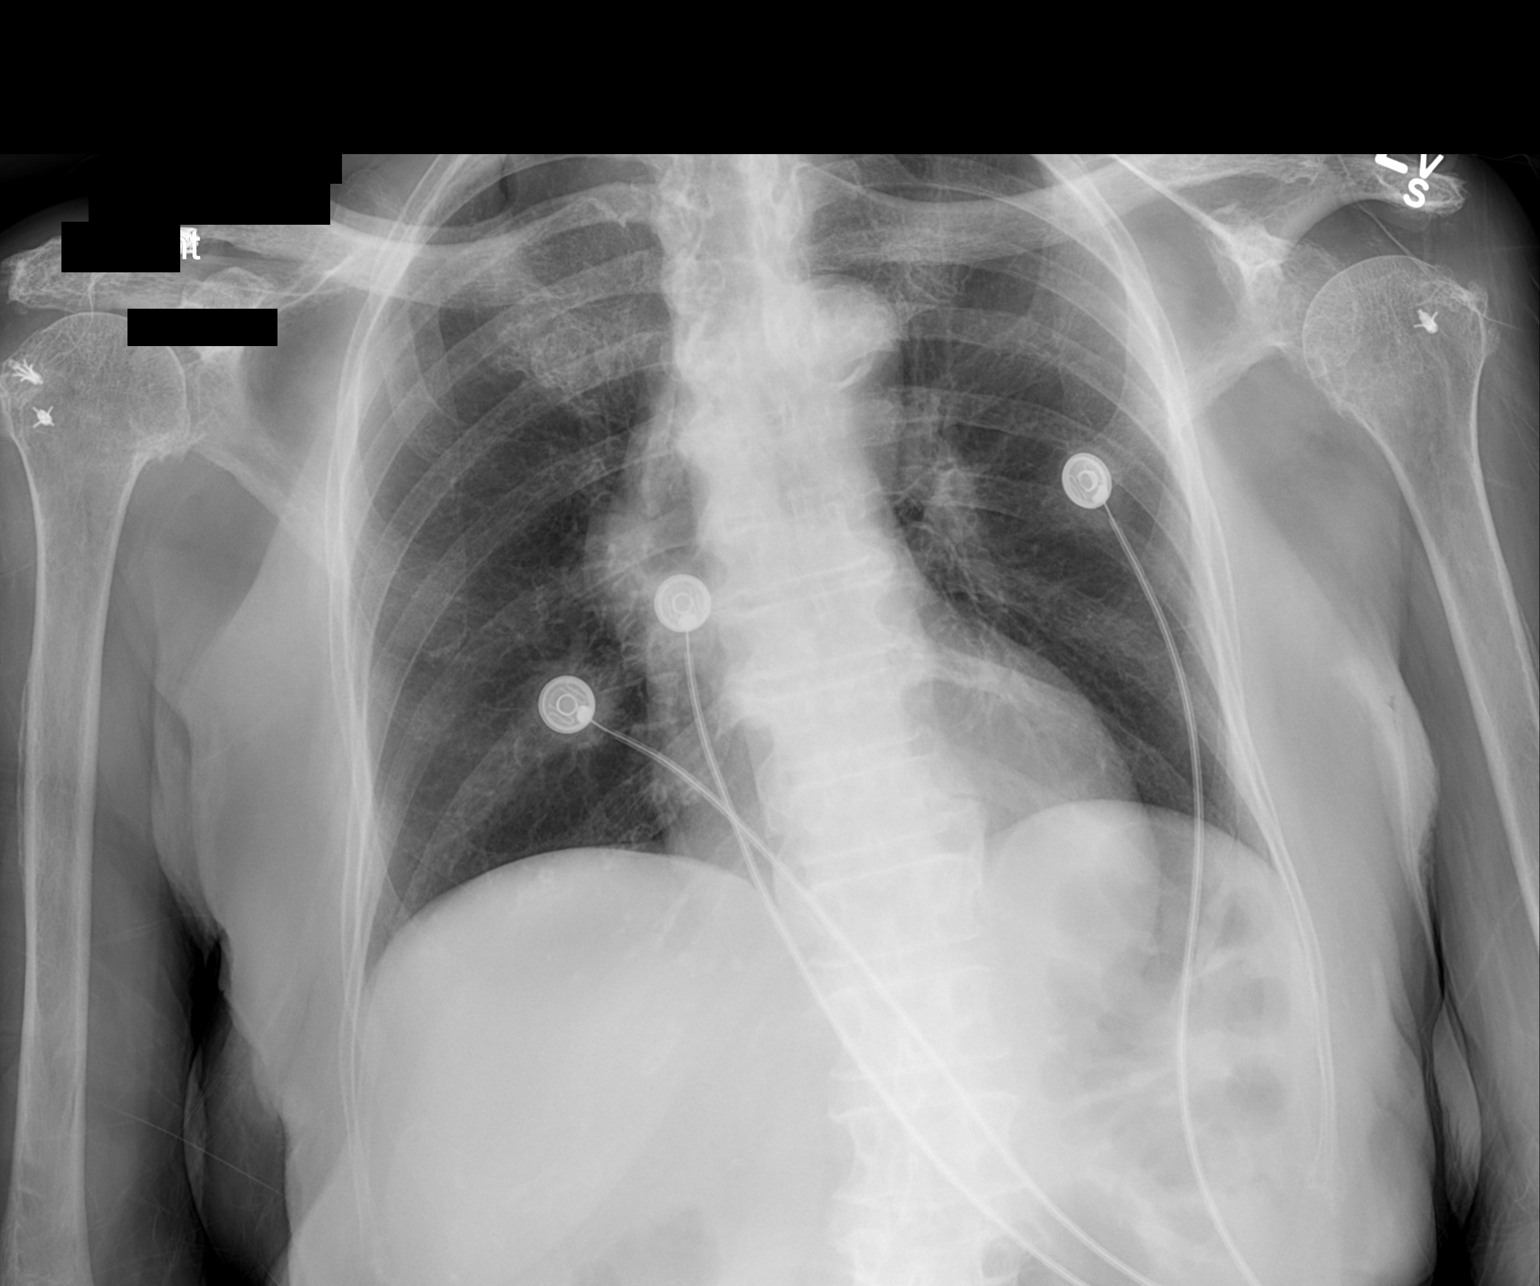

[1 of 1 positions shown; findings below may reference images not displayed]

FINDINGS: Trachea is midline.  Heart size normal.  Thoracic aorta
is calcified.  Lungs are clear.  No pleural fluid.  Degenerative
changes are seen in the shoulders.  Osteopenia.
IMPRESSION: No acute findings.

## 2011-01-30 LAB — I-STAT 8, (EC8 V) (CONVERTED LAB)
BUN: 14
Bicarbonate: 28.2 — ABNORMAL HIGH
Chloride: 104
Glucose, Bld: 180 — ABNORMAL HIGH
HCT: 38
Hemoglobin: 12.9
Operator id: 294341
Potassium: 4.3
Sodium: 134 — ABNORMAL LOW

## 2011-01-30 LAB — URINALYSIS, ROUTINE W REFLEX MICROSCOPIC
Ketones, ur: NEGATIVE
Nitrite: NEGATIVE
Protein, ur: 100 — AB
Urobilinogen, UA: 0.2

## 2011-01-30 LAB — URINE MICROSCOPIC-ADD ON

## 2011-02-05 LAB — DIFFERENTIAL
Basophils Absolute: 0.1
Lymphocytes Relative: 35
Lymphs Abs: 2.1
Neutro Abs: 3.1

## 2011-02-05 LAB — CBC
Hemoglobin: 9.8 — ABNORMAL LOW
Platelets: 201
RDW: 13.3
WBC: 5.9

## 2011-02-05 LAB — POCT I-STAT, CHEM 8
Chloride: 96
Glucose, Bld: 61 — ABNORMAL LOW
HCT: 27 — ABNORMAL LOW
Hemoglobin: 9.2 — ABNORMAL LOW
Potassium: 3.6

## 2011-02-05 LAB — OCCULT BLOOD X 1 CARD TO LAB, STOOL: Fecal Occult Bld: NEGATIVE

## 2011-02-24 LAB — I-STAT 8, (EC8 V) (CONVERTED LAB)
Acid-Base Excess: 1
Bicarbonate: 26.1 — ABNORMAL HIGH
HCT: 32 — ABNORMAL LOW
Operator id: 282201
Sodium: 138
TCO2: 27
pCO2, Ven: 44.7 — ABNORMAL LOW

## 2011-02-24 LAB — POCT I-STAT CREATININE: Creatinine, Ser: 1

## 2012-08-10 DIAGNOSIS — E782 Mixed hyperlipidemia: Secondary | ICD-10-CM

## 2012-08-10 DIAGNOSIS — I1310 Hypertensive heart and chronic kidney disease without heart failure, with stage 1 through stage 4 chronic kidney disease, or unspecified chronic kidney disease: Secondary | ICD-10-CM

## 2012-09-28 DIAGNOSIS — E782 Mixed hyperlipidemia: Secondary | ICD-10-CM

## 2012-09-28 DIAGNOSIS — I1310 Hypertensive heart and chronic kidney disease without heart failure, with stage 1 through stage 4 chronic kidney disease, or unspecified chronic kidney disease: Secondary | ICD-10-CM

## 2012-09-28 DIAGNOSIS — I4891 Unspecified atrial fibrillation: Secondary | ICD-10-CM

## 2012-09-28 DIAGNOSIS — N182 Chronic kidney disease, stage 2 (mild): Secondary | ICD-10-CM

## 2012-10-18 DIAGNOSIS — I4891 Unspecified atrial fibrillation: Secondary | ICD-10-CM

## 2012-10-18 DIAGNOSIS — I6990 Unspecified sequelae of unspecified cerebrovascular disease: Secondary | ICD-10-CM

## 2012-10-18 DIAGNOSIS — E782 Mixed hyperlipidemia: Secondary | ICD-10-CM

## 2012-11-30 ENCOUNTER — Non-Acute Institutional Stay (SKILLED_NURSING_FACILITY): Payer: PRIVATE HEALTH INSURANCE | Admitting: Internal Medicine

## 2012-11-30 DIAGNOSIS — F0280 Dementia in other diseases classified elsewhere without behavioral disturbance: Secondary | ICD-10-CM

## 2012-11-30 DIAGNOSIS — I1310 Hypertensive heart and chronic kidney disease without heart failure, with stage 1 through stage 4 chronic kidney disease, or unspecified chronic kidney disease: Secondary | ICD-10-CM

## 2012-11-30 NOTE — Progress Notes (Signed)
Patient ID: Sara [name], female   DOB: Feb 26, 1930, 79 y.o.   MRN: [id number] Facility; [facility]. Chief complaint; review general medical issues, routine [facility] visit for June. History; patient has been in the building since August 2011. \ Last in hospital for a Left ray amputation in 2012. She has been largely stable and the facility with no recent issues identified.  Past medical history/problem list. #1 dementia. #2 aortic valve disease with a history of aortic stenosis with insufficiency as well as tricuspid valve insufficiency. #3 type 2 diabetes. #4 osteoporosis. #5 chronic renal insufficiency. #6 hypertension. #7 atrial fibrillation. #8 history of cerebrovascular disease on Plavix.  Medication list is reviewed.  Social history reviewed. Patient is a DO NOT RESUSCITATE.  Physical examination; #1 respiratory exam is clear. #2 cardiac harsh systolic murmur with radiation to both carotids. However, no signs of CHF. #3 abdomen no liver no spleen no tenderness.   Impression/plan #1 Mensch patient continues on Aricept 10 mg a. #2 history of cerebrovascular disease on Plavix. #3 hypertension on Zestril 20 mg #4 hyperlipidemia on Lipitor. #5 type 2 diabetes not currently on any medications. Hemoglobin A1c was last checked this month at 6.3. #6 baseline renal insufficiency. Last BUN 39, creatinine 1.35 on July 2. #7 anemia last hemoglobin 8.8 on July 2, down from 10 on January 22. I suspect this is chronic disease.  Patient appears to be very stable at the moment. No additional orders or suggestions

## 2012-12-28 ENCOUNTER — Non-Acute Institutional Stay (SKILLED_NURSING_FACILITY): Payer: PRIVATE HEALTH INSURANCE | Admitting: Internal Medicine

## 2012-12-28 DIAGNOSIS — I359 Nonrheumatic aortic valve disorder, unspecified: Secondary | ICD-10-CM

## 2012-12-28 DIAGNOSIS — I4891 Unspecified atrial fibrillation: Secondary | ICD-10-CM

## 2012-12-28 DIAGNOSIS — F0280 Dementia in other diseases classified elsewhere without behavioral disturbance: Secondary | ICD-10-CM

## 2012-12-28 NOTE — Progress Notes (Signed)
Patient ID: Sara [name], female   DOB: [birth year], 79 y.o.   MRN: [id number] Facility; [facility]. Chief complaint; review general medical issues, routine [facility] visit for July. History; patient has been in the building since August 2011. \ Last in hospital for a Left ray amputation in 2012. She has been largely stable and the facility with no recent issues identified. Recent lab work showed an albumin of 3 a BUN of 39 and a creatinine of 1.35 potassium was 4.9. This compares with a creatinine of 1.19 on 06/07/2012. She is also anemic with a hemoglobin of 8.8 on July 2. This is normochromic normocytic her stool has been negative for blood via guaiac. His is probably anemia of chronic renal insufficiency her creatinine clearance is 32  Past medical history/problem list. #1 dementia. #2 aortic valve disease with a history of aortic stenosis with insufficiency as well as tricuspid valve insufficiency. #3 type 2 diabetes. #4 osteoporosis. #5 chronic renal insufficiency. #6 hypertension. #7 atrial fibrillation. #8 history of cerebrovascular disease on Plavix.  Medication list is reviewed.  Social history reviewed. Patient is a DO NOT RESUSCITATE.  Physical examination; P-89 #1 respiratory exam is clear. #2 cardiac harsh systolic murmur with radiation to both carotids. However, no signs of CHF. #3 abdomen no liver no spleen no tenderness.   Impression/plan #1 Dementia  patient continues on Aricept 10 mg . #2 history of cerebrovascular disease on Plavix. #3 hypertension on Zestril 20 mg #4 hyperlipidemia on Lipitor. #5 type 2 diabetes not currently on any medications. Hemoglobin A1c was last checked this month at 6.3. #6 baseline renal insufficiency. Last BUN 39, creatinine 1.35 on July 2. #7 anemia last hemoglobin 8.8 on July 2, down from 10 on January 22. I suspect this is chronic disease. 8) Arotic stenosis; This is currently asymptomatic. She is not a candidate for aggressive  followup  Patient appears to be very stable at the moment. No additional orders or suggestions

## 2013-01-08 ENCOUNTER — Non-Acute Institutional Stay (SKILLED_NURSING_FACILITY): Payer: PRIVATE HEALTH INSURANCE | Admitting: Internal Medicine

## 2013-01-08 DIAGNOSIS — I359 Nonrheumatic aortic valve disorder, unspecified: Secondary | ICD-10-CM

## 2013-01-08 NOTE — Progress Notes (Signed)
Patient ID: Sara [name], female   DOB: 10/06/1929, 79 y.o.   MRN: [id number] Facility; [facility] Chief complaint followup aortic valve disease History; this is a patient with a history of aortic stenosis. When I reviewed her last week I wanted to come back and review this issue for the purpose of her chart. She has not had any symptoms in particular no symptoms of left ventricular hypertrophy, chest pain or syncope. Given her underlying cognitive impairment but I am doubtful that she would be a candidate for aggressive followup of this problem    --------------------------------------------------------------------  History:  PMH: Murmur. Aortic valve disease. PMH: Myocardial  infarction. Risk factors: Hypertension. Diabetes mellitus.   --------------------------------------------------------------------  Study Conclusions   1. Left ventricle: The cavity size was normal. Wall thickness was     increased in a pattern of mild LVH. There was mild focal basal     hypertrophy of the septum. Systolic function was normal. The     estimated ejection fraction was in the range of 60% to 65%. Wall     motion was normal; there were no regional wall motion     abnormalities. Doppler parameters are consistent with abnormal     left ventricular relaxation (grade 1 diastolic dysfunction).  2. Aortic valve: There was moderate stenosis. Mild to moderate     regurgitation.  3. Mitral valve: Calcified annulus. Mild regurgitation.  4. Tricuspid valve: Mild-moderate regurgitation.  Echocardiography. M-mode, complete 2D, spectral Doppler, and color  Doppler. Weight: Weight: 64kg. Weight: 140.8lb. Patient status:  Inpatient. Location: Bedside.   --------------------------------------------------------------------  Left ventricle: The cavity size was normal. Wall thickness was  increased in a pattern of mild LVH. There was mild focal basal  hypertrophy of the septum. Systolic function was normal. The  estimated ejection fraction was in the range of 60% to 65%. Wall  motion was normal; there were no regional wall motion abnormalities.  Doppler parameters are consistent with abnormal left ventricular  relaxation (grade 1 diastolic dysfunction).  Aortic valve: Trileaflet; moderately calcified leaflets. Doppler:  There was moderate stenosis. Mild to moderate regurgitation.  Mean  gradient: 23mm Hg (S). Peak gradient: 39mm Hg (S).  Aorta: Aortic root: The aortic root was normal in size.  Mitral valve: Calcified annulus. Mobility was not restricted.  Doppler: Transvalvular velocity was within the normal range. There  was no evidence for stenosis. Mild regurgitation.  Peak gradient:  5mm Hg (D).  Left atrium: The atrium was normal in size.  Right ventricle: The cavity size was normal. Systolic function was  normal.  Pulmonic valve:  Doppler: Transvalvular velocity was within the  normal range. There was no evidence for stenosis. Mild  regurgitation.  Tricuspid valve:  Doppler: Transvalvular velocity was within the  normal range. Mild-moderate regurgitation.  Pulmonary artery: Systolic pressure was within the normal range.  Right atrium: The atrium was normal in size.  Pericardium: There was no pericardial effusion.  Systemic veins:  Inferior vena cava: The vessel was normal in size.   --------------------------------------------------------------------   2D measurements            Normal  Doppler measurements       Normal  Left ventricle                     Main pulmonary artery  LVID ED, chord,  37.7 mm   43-52   Pressure, S       30 mm Hg =30  PLAX  Left ventricle  LVID ES, chord,  29.1 mm   23-38   Ea, lat ann,    7.43 cm/s  ------  PLAX                               tiss DP  FS, chord, PLAX    23 %    >29     E/Ea, lat ann, 15.07       ------  LVPW, ED         12.4 mm   ------  tiss DP  IVS/LVPW ratio,  1.05      <1.3    Ea, med ann,    5.76 cm/s   ------  ED                                 tiss DP  Ventricular septum                 E/Ea, med ann, 19.44       ------  IVS, ED            13 mm   ------  tiss DP  LVOT                               Aortic valve  Diam, S            20 mm   ------  Peak vel, S      312 cm/s  ------  Area             3.14 cm^2 ------  Mean vel, S      228 cm/s  ------  Aorta                              VTI, S          79.7 cm    ------  Root diam, ED    27.2 mm   ------  Mean gradient,    23 mm Hg ------  Left atrium                        S  AP dim           30.9 mm   ------  Peak gradient,    39 mm Hg ------                                     S                                     Regurg peak      403 cm/s  ------                                     vel                                     Regurg vel, ED  299 cm/s  ------                                     Regurg PHT       606 ms    ------                                     Regurg peak       65 mm Hg ------                                     gradient                                     Regurg            36 mm Hg ------                                     gradient, ED                                     Mitral valve                                     Peak E vel       112 cm/s  ------                                     Peak A vel       130 cm/s  ------                                     Peak gradient,     5 mm Hg ------                                     D                                     Peak E/A ratio  0.86       ------                                     Tricuspid valve                                     Regurg peak      226 cm/s  ------  vel                                     Peak RV-RA        20 mm Hg ------                                     gradient, S                                     Systemic veins                                     Estimated CVP     10 mm Hg ------                                      Right ventricle                                     Pressure, S       30 mm Hg <30   Prepared and Electronically Authenticated by   Olga Millers, MD, [facility]  [id number]  Physical exam Respiratory clear entry bilaterally Cardiac there is a 4/6 systolic ejection murmur that radiates to the left carotid the. Rhythm appears to be atrial fibrillation there is no S3. There may be a width of a diastolic murmur compatible with the aortic insufficiency. There is absolutely no evidence of CHF   Impression/plan Aortic stenosis; this was listed as moderate in 2010. She certainly has no symptoms now. There is no indication for aggressive followup including serial echocardiograms [facility] she has advanced dementia which is Alzheimer's disease on Aricept. I don't believe she would be a candidate for aortic valve surgery were replacement given this issue even if she were sent to my

## 2013-01-25 ENCOUNTER — Non-Acute Institutional Stay (SKILLED_NURSING_FACILITY): Payer: PRIVATE HEALTH INSURANCE | Admitting: Internal Medicine

## 2013-01-25 DIAGNOSIS — I359 Nonrheumatic aortic valve disorder, unspecified: Secondary | ICD-10-CM

## 2013-01-25 DIAGNOSIS — F0280 Dementia in other diseases classified elsewhere without behavioral disturbance: Secondary | ICD-10-CM

## 2013-01-25 NOTE — Progress Notes (Signed)
Patient ID: Sara [name], female   DOB: June 28, 1929, 79 y.o.   MRN: [id number] Facility; [facility]. Chief complaint; review general medical issues, routine [facility] visit for august History; patient has been in the building since August 2011. \ Last in hospital for a Left ray amputation in 2012. She has been largely stable and the facility with no recent issues identified. Recent lab work showed an albumin of 3 a BUN of 39 and a creatinine of 1.35 potassium was 4.9. This compares with a creatinine of 1.19 on 06/07/2012. She is also anemic with a hemoglobin of 8.8 on July 2. This is normochromic normocytic her stool has been negative for blood via guaiac. His is probably anemia of chronic renal insufficiency her creatinine clearance is 32  Past medical history/problem list. #1 dementia. #2 aortic valve disease with a history of aortic stenosis with insufficiency as well as tricuspid valve insufficiency. #3 type 2 diabetes. #4 osteoporosis. #5 chronic renal insufficiency. #6 hypertension. #7 atrial fibrillation. #8 history of cerebrovascular disease on Plavix.  Medication list is reviewed.  Social history reviewed. Patient is a DO NOT RESUSCITATE.  Physical examination; P-89 #1 respiratory exam is clear. #2 cardiac harsh systolic murmur with radiation to both carotids. However, no signs of CHF. #3 abdomen no liver no spleen no tenderness.   Impression/plan #1 Dementia  patient continues on Aricept 10 mg . #2 history of cerebrovascular disease on Plavix. #3 hypertension on Zestril 20 mg #4 hyperlipidemia on Lipitor. #5 type 2 diabetes not currently on any medications. Hemoglobin A1c was last checked tlast month at 6.3. #6 baseline renal insufficiency. Last BUN 39, creatinine 1.35 on July 2. #7 anemia last hemoglobin 8.8 on July 2, down from 10 on January 22. I suspect this is chronic disease. 8) Arotic stenosis; This is currently asymptomatic. She is not a candidate for aggressive  followup  Patient appears to be very stable at the moment. No additional orders or suggestions

## 2013-03-08 ENCOUNTER — Non-Acute Institutional Stay (SKILLED_NURSING_FACILITY): Payer: PRIVATE HEALTH INSURANCE | Admitting: Internal Medicine

## 2013-03-08 DIAGNOSIS — F0280 Dementia in other diseases classified elsewhere without behavioral disturbance: Secondary | ICD-10-CM

## 2013-03-08 DIAGNOSIS — I359 Nonrheumatic aortic valve disorder, unspecified: Secondary | ICD-10-CM

## 2013-03-08 NOTE — Progress Notes (Signed)
Patient ID: Sara [name], female   DOB: 1929/12/14, 79 y.o.   MRN: [id number] Facility; [facility]. Chief complaint; review general medical issues, routine [facility] visit for September History; patient has been in the building since August 2011. \ Last in hospital for a Left ray amputation in 2012. She has been largely stable and the facility with no recent issues identified. Recent lab work showed an albumin of 3 a BUN of 39 and a creatinine of 1.35 potassium was 4.9. This compares with a creatinine of 1.19 on 06/07/2012. She is also anemic with a hemoglobin of 8.8 on July 2. This is normochromic normocytic her stool has been negative for blood via guaiac. This is probably anemia of chronic renal insufficiency her creatinine clearance is 32. Is noteworthy that her albumin is low at 2.8. Only additional issue is medication noncompliance  Past medical history/problem list. #1 dementia. #2 aortic valve disease with a history of aortic stenosis with insufficiency as well as tricuspid valve insufficiency. #3 type 2 diabetes. #4 osteoporosis. #5 chronic renal insufficiency. #6 hypertension. #7 atrial fibrillation. #8 history of cerebrovascular disease on Plavix.  Medication list is reviewed.  Social history reviewed. Patient is a DO NOT RESUSCITATE.  Physical examination; P-66 #1 respiratory exam is clear. #2 cardiac harsh systolic murmur with radiation to both carotids. However, no signs of CHF. #3 abdomen no liver no spleen no tenderness.   Impression/plan #1 Dementia  patient continues on Aricept 10 mg . #2 history of cerebrovascular disease on Plavix. #3 hypertension on Zestril 20 mg #4 hyperlipidemia on Lipitor. #5 type 2 diabetes not currently on any medications. Hemoglobin A1c was last checked tlast month at 6.3. #6 baseline renal insufficiency. Last BUN 39, creatinine 1.35 on July 2. #7 anemia last hemoglobin 8.8 on July 2, down from 10 on January 22. I suspect this is  chronic disease. 8) Arotic stenosis; This is currently asymptomatic. She is not a candidate for aggressive followup  Patient appears to be very stable at the moment. No additional orders or suggestions

## 2013-03-22 ENCOUNTER — Non-Acute Institutional Stay (SKILLED_NURSING_FACILITY): Payer: PRIVATE HEALTH INSURANCE | Admitting: Internal Medicine

## 2013-03-22 DIAGNOSIS — I359 Nonrheumatic aortic valve disorder, unspecified: Secondary | ICD-10-CM

## 2013-03-22 DIAGNOSIS — E46 Unspecified protein-calorie malnutrition: Secondary | ICD-10-CM

## 2013-03-22 DIAGNOSIS — F0280 Dementia in other diseases classified elsewhere without behavioral disturbance: Secondary | ICD-10-CM

## 2013-03-22 NOTE — Progress Notes (Signed)
Patient ID: Sara [name], female   DOB: 03/18/1930, 79 y.o.   MRN: [id number] Facility; [facility]. Chief complaint; review general medical issues, routine [facility] visit for October History; patient has been in the building since August 2011. Last in hospital for a Left ray amputation in 2012. She has been largely stable and the facility with no recent issues identified. Recent lab work showed an albumin of 3 a BUN of 39 and a creatinine of 1.35 potassium was 4.9. This compares with a creatinine of 1.19 on 06/07/2012. She is also anemic with a hemoglobin of 8.8 on July 2. This is normochromic normocytic her stool has been negative for blood via guaiac. This is probably anemia of chronic renal insufficiency her creatinine clearance is 32. Is noteworthy that her albumin is low at 2.8. Only additional issue is medication noncompliance.   Updated lab work from October 31 showed a BUN of 43 a creatinine of 1.22 potassium 4.7 LDL of 112 albumin of 3  Past medical history/problem list. #1 dementia. #2 aortic valve disease with a history of aortic stenosis with insufficiency as well as tricuspid valve insufficiency. #3 type 2 diabetes. #4 osteoporosis. #5 chronic renal insufficiency. #6 hypertension. #7 atrial fibrillation. #8 history of cerebrovascular disease on Plavix.  Medication list is reviewed.  Social history reviewed. Patient is a DO NOT RESUSCITATE.  Physical examination; P-66 wieght 129lbs #1 respiratory exam is clear. #2 cardiac harsh systolic murmur with radiation to both carotids. However, no signs of CHF. #3 abdomen no liver no spleen no tenderness.   Impression/plan #1 Dementia  patient continues on Aricept 10 mg . #2 history of cerebrovascular disease on Plavix. #3 hypertension on Zestril 20 mg #4 hyperlipidemia on Lipitor. #5 type 2 diabetes not currently on any medications. Hemoglobin A1c was last checked at 6.3 in July #6 baseline renal insufficiency. Last BUN 43,  Cr 1.22 #7 anemia last hemoglobin 8.8 on July 2, down from 10 on January 22. I suspect this is chronic disease. #8 protein calorie although the patient has not lost any weight she has significant hypoalbuminemia. She was started on Remeron as an appetite stimulant this month 8) Arotic stenosis; This is currently asymptomatic. She is not a candidate for aggressive followup  Patient appears to be very stable at the moment. No additional orders or suggestions

## 2013-05-03 ENCOUNTER — Non-Acute Institutional Stay (SKILLED_NURSING_FACILITY): Payer: PRIVATE HEALTH INSURANCE | Admitting: Internal Medicine

## 2013-05-03 DIAGNOSIS — F0280 Dementia in other diseases classified elsewhere without behavioral disturbance: Secondary | ICD-10-CM

## 2013-05-03 DIAGNOSIS — E46 Unspecified protein-calorie malnutrition: Secondary | ICD-10-CM

## 2013-05-03 NOTE — Progress Notes (Signed)
Patient ID: Sara [name], female   DOB: [birth year], 79 y.o.   MRN: [id number] Facility; [facility]. Chief complaint; review general medical issues, routine [employer] reivew offor November. History; patient has been in the building since August 2011. Last in hospital for a Left ray amputation in 2012. She has been largely stable and the facility with no recent issues identified. Recent lab work showed an albumin of 3 a BUN of 39 and a creatinine of 1.35 potassium was 4.9. This compares with a creatinine of 1.19 on 06/07/2012. She is also anemic with a hemoglobin of 8.8 on July 2. This is normochromic normocytic her stool has been negative for blood via guaiac. This is probably anemia of chronic renal insufficiency her creatinine clearance is 32. Is noteworthy that her albumin is low at 2.8. Only additional issue is medication noncompliance.   Updated lab work from October 31 showed a BUN of 43 a creatinine of 1.22 potassium 4.7 LDL of 112 albumin of 3  There does not appear to be been any medical issues this month  Past medical history/problem list. #1 dementia. #2 aortic valve disease with a history of aortic stenosis with insufficiency as well as tricuspid valve insufficiency. #3 type 2 diabetes. #4 osteoporosis. #5 chronic renal insufficiency. #6 hypertension. #7 atrial fibrillation. #8 history of cerebrovascular disease on Plavix.  Medication list is reviewed.  Social history reviewed. Patient is a DO NOT RESUSCITATE.  Physical examination; P-66 wieght 129lbs #1 respiratory exam is clear. #2 cardiac harsh systolic murmur with radiation to both carotids. However, no signs of CHF. #3 abdomen no liver no spleen no tenderness.   Impression/plan #1 Dementia  patient continues on Aricept 10 mg . #2 history of cerebrovascular disease on Plavix. #3 hypertension on Zestril 20 mg #4 hyperlipidemia on Lipitor. #5 type 2 diabetes not currently on any medications. Hemoglobin A1c was last  checked at 6.3 in July #6 baseline renal insufficiency. Last BUN 43, Cr 1.22 #7 anemia last hemoglobin 8.8 on July 2, down from 10 on January 22. I suspect this is chronic disease. #8 protein calorie although the patient has not lost any weight she has significant hypoalbuminemia. She was started on Remeron as an appetite stimulant this month 8) Arotic stenosis; This is currently asymptomatic. She is not a candidate for aggressive followup  Patient appears to be very stable at the moment. No additional orders or suggestions

## 2013-06-05 ENCOUNTER — Non-Acute Institutional Stay (SKILLED_NURSING_FACILITY): Payer: PRIVATE HEALTH INSURANCE | Admitting: Internal Medicine

## 2013-06-05 DIAGNOSIS — I359 Nonrheumatic aortic valve disorder, unspecified: Secondary | ICD-10-CM

## 2013-06-05 DIAGNOSIS — I1310 Hypertensive heart and chronic kidney disease without heart failure, with stage 1 through stage 4 chronic kidney disease, or unspecified chronic kidney disease: Secondary | ICD-10-CM

## 2013-06-05 DIAGNOSIS — G309 Alzheimer's disease, unspecified: Secondary | ICD-10-CM

## 2013-06-05 DIAGNOSIS — F0280 Dementia in other diseases classified elsewhere without behavioral disturbance: Secondary | ICD-10-CM

## 2013-06-05 DIAGNOSIS — D638 Anemia in other chronic diseases classified elsewhere: Secondary | ICD-10-CM

## 2013-06-05 NOTE — Progress Notes (Signed)
Patient ID: Sara [name], female   DOB: 1930-01-28, 80 y.o.   MRN: [id number]  Facility; [facility]. Chief complaint; review general medical issues, routine [employer] reivew for december. History; patient has been in the building since August 2011. Last in hospital for a Left ray amputation in 2012. She has been largely stable and the facility with no recent issues identified. Recent lab work showed an albumin of 3 a BUN of 39 and a creatinine of 1.35 potassium was 4.9. This compares with a creatinine of 1.19 on 06/07/2012. She is also anemic with a hemoglobin of 8.8 on July 2. This is normochromic normocytic her stool has been negative for blood via guaiac. This is probably anemia of chronic renal insufficiency her creatinine clearance is 32. Is noteworthy that her albumin is low at 2.8. Only additional issue is medication noncompliance.   Updated lab work from October 31 showed a BUN of 43 a creatinine of 1.22 potassium 4.7 LDL of 112 albumin of 3  There does not appear to be been any medical issues this month  Past medical history/problem list. #1 dementia. #2 aortic valve disease with a history of aortic stenosis with insufficiency as well as tricuspid valve insufficiency. #3 type 2 diabetes. #4 osteoporosis. #5 chronic renal insufficiency. #6 hypertension. #7 atrial fibrillation. #8 history of cerebrovascular disease on Plavix.  Medication list is reviewed.  Social history reviewed. Patient is a DO NOT RESUSCITATE.  Physical examination; temperature 97.2, pulse 79, respirations 18, blood pressure 131/69, weight 131 pounds, O2 sat is 99% on room air #1 respiratory exam is clear. #2 cardiac harsh systolic murmur with radiation to both carotids. However, no signs of CHF. #3 abdomen no liver no spleen no tenderness.   Impression/plan #1 Dementia  patient continues on Aricept 10 mg . #2 history of cerebrovascular disease on Plavix. #3 hypertension on Zestril 20 mg #4  hyperlipidemia on Lipitor. #5 type 2 diabetes not currently on any medications. Hemoglobin A1c was last checked at 6.3 in July #6 baseline renal insufficiency. Last BUN 43, Cr 1.22 #7 anemia last hemoglobin 8.8 on July 2, down from 10 on January 22. I suspect this is chronic disease but her hemoglobin needs repeating #8 protein calorie although the patient has not lost any weight she has significant hypoalbuminemia. She was started on Remeron as an appetite stimulant last month 8) Arotic stenosis; This is currently asymptomatic. She is not a candidate for aggressive followup  Patient appears to be very stable at the moment. Repeat lab work has been a

## 2013-09-27 ENCOUNTER — Non-Acute Institutional Stay (SKILLED_NURSING_FACILITY): Payer: PRIVATE HEALTH INSURANCE | Admitting: Internal Medicine

## 2013-09-27 DIAGNOSIS — I359 Nonrheumatic aortic valve disorder, unspecified: Secondary | ICD-10-CM

## 2013-09-27 DIAGNOSIS — J849 Interstitial pulmonary disease, unspecified: Secondary | ICD-10-CM

## 2013-09-27 DIAGNOSIS — J8409 Other alveolar and parieto-alveolar conditions: Secondary | ICD-10-CM

## 2013-09-27 DIAGNOSIS — I350 Nonrheumatic aortic (valve) stenosis: Secondary | ICD-10-CM

## 2013-09-28 NOTE — Progress Notes (Addendum)
Patient ID: Norm SaltLeona M Pillars, female   DOB: 02-11-1930, 80 y.o.   MRN: [id number]                  PROGRESS NOTE  DATE:  09/27/2013    FAC[facility]     LEVEL OF CARE:   SNF   Acute Visit   CHIEF COMPLAINT:  Follow up cough, abnormal chest x-ray.    HISTORY OF PRESENT ILLNESS:  This is a patient who is here essentially for advanced dementia.  She also has a history of atrial fibrillation, osteoporosis, protein calorie malnutrition.    She was observed to have a cough by one of the nurses two days ago.  A chest x-ray done on 09/26/2013 showed an interstitial prominence which is concerning for underlying pneumonia.  She was given a Z-Pak and DuoNebs.  I do not see a previous chest x-ray on her.  Looking back on [facility] shows a normal chest x-ray in September 2011.  She has not been ill, not running a fever.  There has been no further coughing.    PHYSICAL EXAMINATION:   VITAL SIGNS:   PULSE:  68.   RESPIRATIONS:  18 and unlabored.   O2 SATURATIONS:  94% on room air.   GENERAL APPEARANCE:  The patient is not in any respiratory distress.   CHEST/RESPIRATORY:  Clear air entry over the bilateral lower and upper lobes.   CARDIOVASCULAR:  CARDIAC:  There is a harsh 3/6 systolic ejection murmur that radiates into both carotids.  However, no evidence of congestive heart failure on bedside exam.    ASSESSMENT/PLAN:  Cough with interstitial prominence, which is concerning for an interstitial pneumonitis suggested on a chest x-ray.  This is a very nonspecific finding without the actual x-ray to look at.  It is hard to really comment on this.  The patient does not appear to be ill.  She was put on Z-Pak, which is appropriate for an atypical pneumonitis.   Her bedside exam seems stable.  There is no respiratory distress.  I do not think she really needs bronchodilators at this point.  Follow-up chest x-ray if the patient has clinical symptoms and signs would be reasonable.     Aortic stenosis, which was moderate in 2010.  Systolic murmur radiating into both carotids.  I have reviewed her echocardiogram from 2010 which at the time showed an ejection fraction of 60-65%, normal wall motion, and grade 1 diastolic dysfunction.  She had moderate aortic valve stenosis and mild to moderate aortic regurgitation.  She does not have any current evidence of heart failure.  No reason to suggest that her interstitial prominence was pulmonary edema.  However, this has probably progressed quite a bit in the last five years.  I do not think, given her underlying dementia, however, that it is worthwhile pursuing this.    She is a DNR.

## 2013-10-02 DIAGNOSIS — J849 Interstitial pulmonary disease, unspecified: Secondary | ICD-10-CM | POA: Insufficient documentation

## 2013-10-02 DIAGNOSIS — I350 Nonrheumatic aortic (valve) stenosis: Secondary | ICD-10-CM | POA: Insufficient documentation

## 2013-10-08 ENCOUNTER — Non-Acute Institutional Stay (SKILLED_NURSING_FACILITY): Payer: PRIVATE HEALTH INSURANCE | Admitting: Internal Medicine

## 2013-10-08 DIAGNOSIS — F0280 Dementia in other diseases classified elsewhere without behavioral disturbance: Secondary | ICD-10-CM

## 2013-10-08 DIAGNOSIS — E46 Unspecified protein-calorie malnutrition: Secondary | ICD-10-CM

## 2013-10-08 DIAGNOSIS — G309 Alzheimer's disease, unspecified: Principal | ICD-10-CM

## 2013-10-08 DIAGNOSIS — I350 Nonrheumatic aortic (valve) stenosis: Secondary | ICD-10-CM

## 2013-10-08 DIAGNOSIS — I1310 Hypertensive heart and chronic kidney disease without heart failure, with stage 1 through stage 4 chronic kidney disease, or unspecified chronic kidney disease: Secondary | ICD-10-CM

## 2013-10-08 DIAGNOSIS — I359 Nonrheumatic aortic valve disorder, unspecified: Secondary | ICD-10-CM

## 2013-10-08 NOTE — Progress Notes (Signed)
Patient ID: Sara [name], female   DOB: [birth year], 80 y.o.   MRN: [id number]   Facility; [facility]. Chief complaint; review general medical issues, routine [employer] reivew for April History; patient has been in the building since August 2011. Last in hospital for a Left ray amputation in 2012. She has been largely stable and the facility with no recent issues identified. She eats 50-75% of meals.   She Was treated for an interstitial pneumonia earlier this month.   Last CMP was in March showing an albumin of 2.8. BUN 39. Cr 1.45 in April   Past medical history/problem list. #1 dementia. #2 aortic valve disease with a history of aortic stenosis with insufficiency as well as tricuspid valve insufficiency. #3 type 2 diabetes. #4 osteoporosis. #5 chronic renal insufficiency. #6 hypertension. #7 atrial fibrillation. #8 history of cerebrovascular disease on Plavix.  Medication list is reviewed.  Social history reviewed. Patient is a DO NOT RESUSCITATE.  Physical examination; T-97.1 P-71-RR-17 BP 152/69 Weight 137 lbs #1 respiratory exam is clear. #2 cardiac harsh systolic murmur with radiation to both carotids. However, no signs of CHF. #3 abdomen no liver no spleen no tenderness.   Impression/plan #1 Dementia  patient continues on Aricept 10 mg . #2 history of cerebrovascular disease on Plavix. #3 hypertension on Zestril 20 mg #4 hyperlipidemia on Lipitor. #5 type 2 diabetes not currently on any medications. Hemoglobin A1c was last checked at 6.3 in July #6 baseline renal insufficiency. Last BUN 39, Cr 1.45 #7 anemia last hemoglobin 8.7 in march, 9.5 in April, suspect chronic disease #8 protein calorie although the patient has not lost any weight she has significant hypoalbuminemia. She was started on Remeron as an appetite stimulant last month 8) Arotic stenosis; This is currently asymptomatic. She is not a candidate for aggressive followup  Patient appears to be very  stable at the moment. Repeat lab work has been a

## 2013-12-04 ENCOUNTER — Non-Acute Institutional Stay (SKILLED_NURSING_FACILITY): Payer: PRIVATE HEALTH INSURANCE | Admitting: Internal Medicine

## 2013-12-04 DIAGNOSIS — I359 Nonrheumatic aortic valve disorder, unspecified: Secondary | ICD-10-CM

## 2013-12-04 DIAGNOSIS — I350 Nonrheumatic aortic (valve) stenosis: Secondary | ICD-10-CM

## 2013-12-04 DIAGNOSIS — D638 Anemia in other chronic diseases classified elsewhere: Secondary | ICD-10-CM

## 2013-12-04 DIAGNOSIS — I1310 Hypertensive heart and chronic kidney disease without heart failure, with stage 1 through stage 4 chronic kidney disease, or unspecified chronic kidney disease: Secondary | ICD-10-CM

## 2013-12-04 DIAGNOSIS — F0280 Dementia in other diseases classified elsewhere without behavioral disturbance: Secondary | ICD-10-CM

## 2013-12-04 DIAGNOSIS — G309 Alzheimer's disease, unspecified: Principal | ICD-10-CM

## 2013-12-04 NOTE — Progress Notes (Signed)
Patient ID: Sara SaltLeona M [name], female   DOB: Sep 08, 1929, 80 y.o.   MRN: [id number]   Facility; [facility]. Chief complaint; review general medical issues, routine [employer] reivew for june History; patient has been in the building since August 2011. Last in hospital for a Left ray amputation in 2012. She has been largely stable and the facility with no recent issues identified. She eats 50-75% of meals.   She Was treated for an interstitial pneumonia tow months ago.   Laboratory; on 11/14/2013 CBC showed a hemoglobin of 8.5 down from 9.5 on 4/20. Comprehensive metabolic panel showed a BUN of 46 creatinine 1.80 albumin is 3 LDL cholesterol at 138 hemoglobin A1c at 6.7   Past medical history/problem list. #1 dementia. #2 aortic valve disease with a history of aortic stenosis with insufficiency as well as tricuspid valve insufficiency. #3 type 2 diabetes. #4 osteoporosis. #5 chronic renal insufficiency. #6 hypertension. #7 atrial fibrillation. #8 history of cerebrovascular disease on Plavix.  Medications Vitamin D3 50,000 units monthly Nutritional supplement twice a day Protonic 40 daily Multivitamin 1 daily Plavix 75 daily Lisinopril 20 daily Norvasc 10 daily Niferex 150 Monday Wednesday and Friday Aricept 10 each bedtime  Social history reviewed. Patient is a DO NOT RESUSCITATE.  Physical examination; temperature 97.8-pulse 66-respirations 18-blood pressure 187/57-weight is 149 pounds-O2 sat 99% on room #1 respiratory exam is clear. #2 cardiac harsh systolic murmur with radiation to both carotids. However, no signs of CHF. #3 abdomen no liver no spleen no tenderness. #4 nonverbal secondary to advanced dementia   Impression/plan #1 Dementia  patient continues on Aricept 10 mg . #2 history of cerebrovascular disease on Plavix. #3 hypertension on Zestril 20 mg #4 hyperlipidemia on Lipitor. #5 type 2 diabetes not currently on any medications. Hemoglobin A1c was last checked at 6.3  in July #6 baseline renal insufficiency. BUN/creatinine slightly worse #7 anemia last hemoglobin 8.7 in march, 9.5 in April and 8.5 this month. #8 protein calorie although the patient has not lost any weight she has significant hypoalbuminemia. She was started on Remeron as an appetite stimulant last month 8) Arotic stenosis; This is currently asymptomatic. She is not a candidate for aggressive followup

## 2014-02-07 ENCOUNTER — Non-Acute Institutional Stay (SKILLED_NURSING_FACILITY): Payer: PRIVATE HEALTH INSURANCE | Admitting: Internal Medicine

## 2014-02-07 DIAGNOSIS — D638 Anemia in other chronic diseases classified elsewhere: Secondary | ICD-10-CM

## 2014-02-07 DIAGNOSIS — I359 Nonrheumatic aortic valve disorder, unspecified: Secondary | ICD-10-CM

## 2014-02-07 DIAGNOSIS — G309 Alzheimer's disease, unspecified: Principal | ICD-10-CM

## 2014-02-07 DIAGNOSIS — F0280 Dementia in other diseases classified elsewhere without behavioral disturbance: Secondary | ICD-10-CM

## 2014-02-07 DIAGNOSIS — I350 Nonrheumatic aortic (valve) stenosis: Secondary | ICD-10-CM

## 2014-02-07 NOTE — Progress Notes (Signed)
Patient ID: Norm SaltLeona M Fauble, female   DOB: 1930/02/07, 80 y.o.   MRN: [id number]   Facility; [facility]. Chief complaint; review general medical issues, routine [employer] reivew  History; patient has been in the building since August 2011. Last in hospital for a Left ray amputation in 2012. She has been largely stable and the facility with no recent issues identified. She eats 50-75% of meals.     Laboratory; On 9/21 sodium of 139 potassium 4.9 BUN 39 creatinine of 2 TSH of 8.268 On 8/5 white count of 6.1 hemoglobin of 8.4 platelet count of 219 and a TSH of 7.949 On 7/6 hemoglobin of 8.5, BUN 46 creatinine 1.8 LDL of 138 hemoglobin A1c of 6.7    Past medical history/problem list. #1 dementia. #2 aortic valve disease with a history of aortic stenosis with insufficiency as well as tricuspid valve insufficiency. #3 type 2 diabetes. #4 osteoporosis. #5 chronic renal insufficiency. #6 hypertension. #7 atrial fibrillation. #8 history of cerebrovascular disease on Plavix.  Medications Vitamin D3 50,000 units monthly Nutritional supplement twice a day Protonic 40 daily Multivitamin 1 daily Plavix 75 daily Lisinopril 20 daily Norvasc 10 daily Niferex 150 Monday Wednesday and Friday Aricept 10 each bedtime Crestor 10 mg daily Prinivil 30 mg daily  Social history reviewed. Patient is a DO NOT RESUSCITATE.  Physical examination; temperature 97.8-pulse 66-respirations 18-blood pressure 142/88-weight is 148 pounds-O2 sat 99% on room #1 respiratory exam is clear. #2 cardiac harsh systolic murmur with radiation to both carotids. However, no signs of CHF. #3 abdomen no liver no spleen no tenderness. #4 nonverbal secondary to advanced dementia   Impression/plan #1 Dementia  patient continues on Aricept 10 mg . #2 history of cerebrovascular disease on Plavix. #3 hypertension on Zestril 20 mg #4 hyperlipidemia on crestor #5 type 2 diabetes not currently on any medications. Hemoglobin  A1c was last checked at 6.3 in July #6 baseline renal insufficiency. BUN/creatinine slightly worse #7 anemia last hemoglobin 8.7 in march, 9.5 in April and 8.5 this month likely chronic disease #8 protein calorie although the patient has not lost any weight she has significant hypoalbuminemia. She was started on Remeron as an appetite stimulant last month 8) Arotic stenosis; This is currently asymptomatic. She is not a candidate for aggressive followup

## 2014-04-09 ENCOUNTER — Non-Acute Institutional Stay (SKILLED_NURSING_FACILITY): Payer: PRIVATE HEALTH INSURANCE | Admitting: Internal Medicine

## 2014-04-09 DIAGNOSIS — F0280 Dementia in other diseases classified elsewhere without behavioral disturbance: Secondary | ICD-10-CM

## 2014-04-09 DIAGNOSIS — D638 Anemia in other chronic diseases classified elsewhere: Secondary | ICD-10-CM

## 2014-04-09 DIAGNOSIS — G309 Alzheimer's disease, unspecified: Secondary | ICD-10-CM

## 2014-04-09 NOTE — Progress Notes (Signed)
Patient ID: Sara [name], female   DOB: [birth year], 80 y.o.   MRN: [id number]   Facility; [facility]. Chief complaint; review general medical issues, routine [employer] reivew  History; patient has been in the building since August 2011. Last in hospital for a Left ray amputation in 2012. She has been largely stable and the facility with no recent issues identified. She eats 50-75% of meals. No weight change    Laboratory; On 9/21 sodium of 139 potassium 4.9 BUN 39 creatinine of 2 TSH of 8.268 On 8/5 white count of 6.1 hemoglobin of 8.4 platelet count of 219 and a TSH of 7.949 On 10/9's white blood count 5, hemoglobin 9.2, platelet count 238 On 10/19 TSH 3.87 On 10/9 hemoglobin A1c of 7    Past medical history/problem list. #1 dementia. #2 aortic valve disease with a history of aortic stenosis with insufficiency as well as tricuspid valve insufficiency. #3 type 2 diabetes. #4 osteoporosis. #5 chronic renal insufficiency. #6 hypertension. #7 atrial fibrillation. #8 history of cerebrovascular disease on Plavix.   Social history reviewed. Patient is a DO NOT RESUSCITATE.  Physical examination; temperature 97.8-pulse 74-respirations 18-blood pressure 140/80-weight is 144 pounds-O2 sat 99% on room  respiratory exam is clear.  cardiac harsh systolic murmur with radiation to both carotids. However, no signs of CHF. abdomen no liver no spleen no tenderness.  nonverbal secondary to advanced dementia   Impression/plan #1 Dementia  patient continues on Aricept 10 mg . #2 history of cerebrovascular disease on Plavix. #3 hypertension on Zestril 20 mg #4 hyperlipidemia on crestor #5 type 2 diabetes not currently on any medications. Hemoglobin A1c at 7 is stable #6 baseline renal insufficiency. BUN/creatinine slightly worse #7 anemia; last hemoglobin at 9.2 likely chronic disease #8 protein calorie although the patient has not lost any weight she has significant hypoalbuminemia. She was  started on Remeron as an appetite stimulant last month 8) Arotic stenosis; This is currently asymptomatic. She is not a candidate for aggressive followup

## 2014-05-09 ENCOUNTER — Non-Acute Institutional Stay (SKILLED_NURSING_FACILITY): Payer: PRIVATE HEALTH INSURANCE | Admitting: Internal Medicine

## 2014-05-09 DIAGNOSIS — F0280 Dementia in other diseases classified elsewhere without behavioral disturbance: Secondary | ICD-10-CM

## 2014-05-09 DIAGNOSIS — I350 Nonrheumatic aortic (valve) stenosis: Secondary | ICD-10-CM

## 2014-05-09 DIAGNOSIS — G309 Alzheimer's disease, unspecified: Secondary | ICD-10-CM

## 2014-05-09 DIAGNOSIS — N189 Chronic kidney disease, unspecified: Secondary | ICD-10-CM

## 2014-05-15 NOTE — Progress Notes (Signed)
Patient ID: Sara [name], female   DOB: Jul 21, 1929, 81 y.o.   MRN: [id number]              PROGRESS NOTE  DATE:  05/09/2014          FACILITY: [facility]    LEVEL OF CARE:   SNF   Routine Visit   CHIEF COMPLAINT:  Review of general medical issues/[employer] visit.    HISTORY OF PRESENT ILLNESS:  This is a patient who has been in the facility since August 2011.  She has been generally stable in the facility.    She has advanced dementia.  She is essentially nonverbal.    PAST MEDICAL HISTORY/PROBLEM LIST:                    Dementia, which is advanced.  She is a DNR.    History of aortic valvular disease with aortic stenosis and insufficiency, as well as tricuspid valve insufficiency.  She is not a candidate for aggressive follow-up or treatment of this.    Type 2 diabetes.    Osteoporosis.    Chronic renal insufficiency.    Hypertension.     Atrial fibrillation.    History of a CVA.  On Plavix.    SOCIAL HISTORY:    ADVANCED DIRECTIVES:  She is a Do Not Resuscitate.    PHYSICAL EXAMINATION:   VITAL SIGNS:   WEIGHT:  144 pounds, which is generally stable.   O2 SATURATIONS:  99% on room air.   PULSE:  79.   BLOOD PRESSURE:  130/80.   CHEST/RESPIRATORY:  Clear air entry bilaterally.   CARDIOVASCULAR:  CARDIAC:  She has a harsh systolic murmur with radiation to both carotids.  There are no signs of CHF.   GASTROINTESTINAL:  LIVER/SPLEEN/KIDNEYS:  No liver, no spleen.  No tenderness.   NEUROLOGICAL:   She is nonverbal secondary to advanced dementia.     ASSESSMENT/PLAN:                 Dementia.  Likely a mixture of Alzheimer's disease and multiple infarcts.    Hypertension.  This has been stable.    Type 2 diabetes.  This has not been on any current medications.  Last hemoglobin A1c was 7 on 02/17/2014.    Chronic renal insufficiency.  Her last creatinine on the chart was from September at 2.   Lab work from earlier this month was not obtained.  I will need to  check into this.    Aortic stenosis/valvular heart disease.   This is not associated by any overt symptoms.  She is not a candidate for aggressive follow-up.

## 2014-05-31 ENCOUNTER — Encounter: Payer: Self-pay | Admitting: Internal Medicine

## 2014-05-31 ENCOUNTER — Non-Acute Institutional Stay (SKILLED_NURSING_FACILITY): Payer: Medicare Other | Admitting: Internal Medicine

## 2014-05-31 DIAGNOSIS — G309 Alzheimer's disease, unspecified: Secondary | ICD-10-CM

## 2014-05-31 DIAGNOSIS — F0280 Dementia in other diseases classified elsewhere without behavioral disturbance: Secondary | ICD-10-CM

## 2014-05-31 DIAGNOSIS — I350 Nonrheumatic aortic (valve) stenosis: Secondary | ICD-10-CM

## 2014-05-31 DIAGNOSIS — N189 Chronic kidney disease, unspecified: Secondary | ICD-10-CM

## 2014-05-31 NOTE — Progress Notes (Signed)
This encounter was created in error - please disregard.

## 2014-06-01 NOTE — Progress Notes (Addendum)
Patient ID: Sara [name], female   DOB: Jun 05, 1929, 81 y.o.   MRN: [id number]              PROGRESS NOTE  DATE:  05/30/2014                FACILITY: [facility]      LEVEL OF CARE:   SNF   Routine Visit   CHIEF COMPLAINT:  Review of general medical issues/[employer] visit.     HISTORY OF PRESENT ILLNESS:  This is a patient who has been in the facility since 2011.  Her predominant disability is severe dementia.    Major issues recently have been declining hemoglobin and OB-positive stools.  Last hemoglobin on 05/01/2014 was 9.4.  She does have chronic renal insufficiency with a BUN of 32 and a creatinine of 2.04.  She has been on iron supplements.  She remains on Plavix.    PAST MEDICAL HISTORY/PROBLEM LIST:                       Dementia, which is advanced.  She is a DNR.    History of aortic valve disease with aortic stenosis and insufficiency as well as tricuspid valve insufficiency.     Type 2 diabetes.    Osteoporosis.    Chronic renal insufficiency.     Hypertension.    Atrial fibrillation.    History of a CVA.  On Plavix.    CURRENT MEDICATIONS:  Medication list is reviewed.     Plavix 75 q.d.    Norvasc 10 q.d.    Ferrex 150 daily.    Prinivil 20 q.d.    Synthroid 37.5 q.d.      Protonix 40 b.i.d.      Crestor 10 q.d.     Aricept 10 q.d.    Catapres 0.1 q.h.s.     Vitamin D3, 50,000 U every month.    SOCIAL HISTORY:               ADVANCED DIRECTIVES:  She is a DNR.     PHYSICAL EXAMINATION:   VITAL SIGNS:   O2 SATURATIONS:  97% on room air.   PULSE:  72.   RESPIRATIONS:  24.   BLOOD PRESSURE:  132/76.   GENERAL APPEARANCE:  The patient is in no distress.   CHEST/RESPIRATORY:  Exam is clear.    CARDIOVASCULAR:  CARDIAC:  She has a harsh systolic murmur with radiation to both carotids, compatible with aortic stenosis.   GASTROINTESTINAL:  LIVER/SPLEEN/KIDNEYS:  No liver, no spleen.  No tenderness.   NEUROLOGICAL:   She is nonverbal  secondary to advanced dementia.    ASSESSMENT/PLAN:                      Dementia.  Likely a mixture of Alzheimer's disease and multiple infarcts.    Hypertension.  This has been stable.    Iron deficiency anemia.  On iron, Plavix, and Protonix b.i.d.    She is not a good candidate for GI work-up.  I am not sure whether her family wishes transfusions if that is necessary.    Hypothyroidism.  On replacement.     Aortic valve stenosis.  She is not a candidate for follow-up of this.    History of atrial fibrillation.  Rate is controlled.  She is on Plavix.    Chronic renal insufficiency.  Her last creatinine was 2.04.

## 2014-06-06 ENCOUNTER — Non-Acute Institutional Stay (SKILLED_NURSING_FACILITY): Payer: Medicare Other | Admitting: Internal Medicine

## 2014-06-06 DIAGNOSIS — I5041 Acute combined systolic (congestive) and diastolic (congestive) heart failure: Secondary | ICD-10-CM

## 2014-06-06 DIAGNOSIS — R410 Disorientation, unspecified: Secondary | ICD-10-CM

## 2014-06-06 NOTE — Progress Notes (Signed)
Patient ID: Sara [name], female   DOB: December 02, 1929, 81 y.o.   MRN: [id number]              PROGRESS NOTE  DATE: 06/06/2014        FACILITY: [facility]    LEVEL OF CARE:   SNF   Routine Visit   CHIEF COMPLAINT: Altered LOC/respiratory distress  HISTORY OF PRESENT ILLNESS:  This is a patient who has been in the facility since August 2011.  She has been generally stable in the facility.    She has advanced dementia.  She is essentially nonverbal.    Staff tell me that the patient got up this morning his usual. She was in her usual state. She became unresponsive and went into some degree of respiratory distress. There also might of been some total body "spasms" lasting about 30 seconds. I would wonder about seizures.  PAST MEDICAL HISTORY/PROBLEM LIST:                    Dementia, which is advanced.  She is a DNR.    History of aortic valvular disease with aortic stenosis and insufficiency, as well as tricuspid valve insufficiency.  She is not a candidate for aggressive follow-up or treatment of this.    Type 2 diabetes.    Osteoporosis.    Chronic renal insufficiency.    Hypertension.     Atrial fibrillation.    History of a CVA.  On Plavix.    SOCIAL HISTORY:    ADVANCED DIRECTIVES:  She is a Do Not Resuscitate.    PHYSICAL EXAMINATION:   Gen.; the patient is minimally responsive. Grunting respirations VITAL SIGNS:    O2 SATURATIONS:  95% on room air. , RR26 PULSE:  128 BLOOD PRESSURE:  180/90   CHEST/RESPIRATORY:  Coarse upper airway sounds. Prolonged expiratory phase and expiratory wheezing   CARDIOVASCULAR:  CARDIAC: She is tachycardic. She has a harsh systolic murmur with radiation to both carotids.  JVP is not clearly elevated GASTROINTESTINAL:  LIVER/SPLEEN/KIDNEYS:  No liver, no spleen.  No tenderness.   NEUROLOGICAL:   She is nonverbal secondary to advanced dementia.  Pupils are small and unresponsive. She does not respond to voice or tactile  stimulus.  Lab work to date. Chest x-ray which I looked at with the multiple radiology tech shows CHF. There is no clear pneumonia. EKG shows Q waves from V1 to V4 with possible early ST elevation in V2 V3. There is ST depression in V5 and V6 sinus tachycardia. All of this is new since 2012.  ASSESSMENT/PLAN:                #1 congestive heart failure secondary to possibly an acute myocardial infarction. Lab work is pending including  comprehensive metabolic panel CBC troponin I. #2 history of chronic renal failure with a baseline creatinine of around 2. #3 history of severe aortic stenosis/aortic insufficiency; the aortic stenosis was listed as moderate. An echocardiogram in 2010. #4 apparently the urine has been sent that were showed suggestion of possible UTI. Impaired antibiotics probably wouldn't be out of the question here as certainly a pyelonephritis would contribute to A cardiac decompensation. #5 altered LOC. It is difficult to refute a recurrent stroke. She does have an extensor plantar on the right. Pupils are pinpoint. This also could be coexistent delirium on top of end-stage dementia.  I have spoken to the patient's daughter. She is a DO NOT RESUSCITATE and apparently for now no hospitalizations.  I will put her on nebulizers Lasix. A Foley catheter has been placed. Consideration of an periodic antibiotics aimed at A urinary tract infection. Her chest x-ray did not suggest pneumonia. At this point I have no plans to hospitalize her based on my discussion with her daughter, no plans for neuro imaging.   [facility]*                     *[facility]*                           [address]                          [place], [place] [address]                              [phone]   --------------------------------------------------------------------  Echocardiography   Patient:    Sara [name], Sara [name]  MR #:       [id number]  Study Date: 12/28/2008   Gender:     F  Age:        89  Height:  Weight:     64kg  BSA:  Pt. Status:  Room:       3035    PERFORMING   , [facility]   SONOGRAPHER  Perley Jainich Lombardo, RDCS   ORDERING     Virgie DadAbella, Erlinda  cc:   --------------------------------------------------------------------  Indications:   CVA 436.   --------------------------------------------------------------------  History:  PMH: Murmur. Aortic valve disease. PMH: Myocardial  infarction. Risk factors: Hypertension. Diabetes mellitus.   --------------------------------------------------------------------  Study Conclusions   1. Left ventricle: The cavity size was normal. Wall thickness was     increased in a pattern of mild LVH. There was mild focal basal     hypertrophy of the septum. Systolic function was normal. The     estimated ejection fraction was in the range of 60% to 65%. Wall     motion was normal; there were no regional wall motion     abnormalities. Doppler parameters are consistent with abnormal     left ventricular relaxation (grade 1 diastolic dysfunction).  2. Aortic valve: There was moderate stenosis. Mild to moderate     regurgitation.  3. Mitral valve: Calcified annulus. Mild regurgitation.  4. Tricuspid valve: Mild-moderate regurgitation.  Echocardiography. [name]-mode, complete 2D, spectral Doppler, and color  Doppler. Weight: Weight: 64kg. Weight: 140.8lb. Patient status:  Inpatient. Location: Bedside.   --------------------------------------------------------------------  Left ventricle: The cavity size was normal. Wall thickness was  increased in a pattern of mild LVH. There was mild focal basal  hypertrophy of the septum. Systolic function was normal. The  estimated ejection fraction was in the range of 60% to 65%. Wall  motion was normal; there were no regional wall motion abnormalities.  Doppler parameters are consistent with abnormal left ventricular  relaxation (grade 1 diastolic dysfunction).   Aortic valve: Trileaflet; moderately calcified leaflets. Doppler:  There was moderate stenosis. Mild to moderate regurgitation.  Mean  gradient: 23mm Hg (S). Peak gradient: 39mm Hg (S).  Aorta: Aortic root: The aortic root was normal in size.  Mitral valve: Calcified annulus. Mobility was not restricted.  Doppler: Transvalvular velocity was within the normal range. There  was no evidence for stenosis. Mild regurgitation.  Peak gradient:  5mm Hg (D).  Left atrium:  The atrium was normal in size.  Right ventricle: The cavity size was normal. Systolic function was  normal.  Pulmonic valve:  Doppler: Transvalvular velocity was within the  normal range. There was no evidence for stenosis. Mild  regurgitation.  Tricuspid valve:  Doppler: Transvalvular velocity was within the  normal range. Mild-moderate regurgitation.  Pulmonary artery: Systolic pressure was within the normal range.  Right atrium: The atrium was normal in size.  Pericardium: There was no pericardial effusion.  Systemic veins:  Inferior vena cava: The vessel was normal in size.   --------------------------------------------------------------------   2D measurements            Normal  Doppler measurements       Normal  Left ventricle                     Main pulmonary artery  LVID ED, chord,  37.7 mm   43-52   Pressure, S       30 mm Hg =30  PLAX                               Left ventricle  LVID ES, chord,  29.1 mm   23-38   Ea, lat ann,    7.43 cm/s  ------  PLAX                               tiss DP  FS, chord, PLAX    23 %    >29     E/Ea, lat ann, 15.07       ------  LVPW, ED         12.4 mm   ------  tiss DP  IVS/LVPW ratio,  1.05      <1.3    Ea, med ann,    5.76 cm/s  ------  ED                                 tiss DP  Ventricular septum                 E/Ea, med ann, 19.44       ------  IVS, ED            13 mm   ------  tiss DP  LVOT                               Aortic valve  Diam, S            20 mm   ------   Peak vel, S      312 cm/s  ------  Area             3.14 cm^2 ------  Mean vel, S      228 cm/s  ------  Aorta                              VTI, S          79.7 cm    ------  Root diam, ED    27.2 mm   ------  Mean gradient,    23 mm Hg ------  Left atrium  S  AP dim           30.9 mm   ------  Peak gradient,    39 mm Hg ------                                     S                                     Regurg peak      403 cm/s  ------                                     vel                                     Regurg vel, ED   299 cm/s  ------                                     Regurg PHT       606 ms    ------                                     Regurg peak       65 mm Hg ------                                     gradient                                     Regurg            36 mm Hg ------                                     gradient, ED                                     Mitral valve                                     Peak E vel       112 cm/s  ------                                     Peak A vel       130 cm/s  ------                                     Peak gradient,     5 mm  Hg ------                                     D                                     Peak E/A ratio  0.86       ------                                     Tricuspid valve                                     Regurg peak      226 cm/s  ------                                     vel                                     Peak RV-RA        20 mm Hg ------                                     gradient, S                                     Systemic veins                                     Estimated CVP     10 mm Hg ------                                     Right ventricle                                     Pressure, S       30 mm Hg <30   Prepared and Electronically Authenticated by   Olga Millers, MD, [facility]  [id number]

## 2014-06-07 ENCOUNTER — Other Ambulatory Visit: Payer: Self-pay | Admitting: *Deleted

## 2014-06-07 MED ORDER — AMBULATORY NON FORMULARY MEDICATION: Status: AC

## 2014-06-12 NOTE — Telephone Encounter (Signed)
[facility] 

## 2014-06-12 DEATH — deceased
